# Patient Record
Sex: Male | Born: 1985 | Race: White | Hispanic: No | Smoking: Never smoker
Health system: Southern US, Community
[De-identification: ages and names within clinical notes are randomized; demographics above are authoritative.]

## PROBLEM LIST (undated history)

## (undated) DIAGNOSIS — F32A Depression, unspecified: Secondary | ICD-10-CM

## (undated) DIAGNOSIS — K921 Melena: Secondary | ICD-10-CM

## (undated) DIAGNOSIS — G43909 Migraine, unspecified, not intractable, without status migrainosus: Secondary | ICD-10-CM

## (undated) HISTORY — DX: Depression, unspecified: F32.A

## (undated) HISTORY — DX: Migraine, unspecified, not intractable, without status migrainosus: G43.909

## (undated) HISTORY — DX: Melena: K92.1

---

## 2021-08-22 ENCOUNTER — Encounter: Payer: Self-pay | Admitting: Emergency Medicine

## 2021-08-22 ENCOUNTER — Ambulatory Visit: Payer: BC Managed Care – PPO | Admitting: Emergency Medicine

## 2021-08-22 ENCOUNTER — Other Ambulatory Visit: Payer: Self-pay | Admitting: Emergency Medicine

## 2021-08-22 VITALS — BP 120/86 | HR 84 | Temp 98.2°F | Ht 72.0 in | Wt 313.0 lb

## 2021-08-22 DIAGNOSIS — M545 Low back pain, unspecified: Secondary | ICD-10-CM | POA: Diagnosis not present

## 2021-08-22 DIAGNOSIS — K529 Noninfective gastroenteritis and colitis, unspecified: Secondary | ICD-10-CM | POA: Diagnosis not present

## 2021-08-22 DIAGNOSIS — Z7689 Persons encountering health services in other specified circumstances: Secondary | ICD-10-CM

## 2021-08-22 DIAGNOSIS — Z23 Encounter for immunization: Secondary | ICD-10-CM

## 2021-08-22 DIAGNOSIS — R109 Unspecified abdominal pain: Secondary | ICD-10-CM | POA: Diagnosis not present

## 2021-08-22 DIAGNOSIS — F32A Depression, unspecified: Secondary | ICD-10-CM

## 2021-08-22 DIAGNOSIS — G8929 Other chronic pain: Secondary | ICD-10-CM

## 2021-08-22 DIAGNOSIS — E559 Vitamin D deficiency, unspecified: Secondary | ICD-10-CM

## 2021-08-22 LAB — CBC WITH DIFFERENTIAL/PLATELET
Basophils Absolute: 0 10*3/uL (ref 0.0–0.1)
Basophils Relative: 0.7 % (ref 0.0–3.0)
Eosinophils Absolute: 0.2 10*3/uL (ref 0.0–0.7)
Eosinophils Relative: 2.8 % (ref 0.0–5.0)
HCT: 45.4 % (ref 39.0–52.0)
Hemoglobin: 15.7 g/dL (ref 13.0–17.0)
Lymphocytes Relative: 30.8 % (ref 12.0–46.0)
Lymphs Abs: 1.7 10*3/uL (ref 0.7–4.0)
MCHC: 34.5 g/dL (ref 30.0–36.0)
MCV: 89.6 fl (ref 78.0–100.0)
Monocytes Absolute: 0.5 10*3/uL (ref 0.1–1.0)
Monocytes Relative: 9.5 % (ref 3.0–12.0)
Neutro Abs: 3.1 10*3/uL (ref 1.4–7.7)
Neutrophils Relative %: 56.2 % (ref 43.0–77.0)
Platelets: 159 10*3/uL (ref 150.0–400.0)
RBC: 5.06 Mil/uL (ref 4.22–5.81)
RDW: 13.8 % (ref 11.5–15.5)
WBC: 5.5 10*3/uL (ref 4.0–10.5)

## 2021-08-22 LAB — LIPID PANEL
Cholesterol: 183 mg/dL (ref 0–200)
HDL: 46.2 mg/dL (ref >39.00)
LDL Cholesterol: 102 mg/dL — ABNORMAL HIGH (ref 0–99)
NonHDL: 136.43
Total CHOL/HDL Ratio: 4
Triglycerides: 173 mg/dL — ABNORMAL HIGH (ref 0.0–149.0)
VLDL: 34.6 mg/dL (ref 0.0–40.0)

## 2021-08-22 LAB — COMPREHENSIVE METABOLIC PANEL
ALT: 25 U/L (ref 0–53)
AST: 16 U/L (ref 0–37)
Albumin: 4.8 g/dL (ref 3.5–5.2)
Alkaline Phosphatase: 58 U/L (ref 39–117)
BUN: 17 mg/dL (ref 6–23)
CO2: 25 mEq/L (ref 19–32)
Calcium: 9.5 mg/dL (ref 8.4–10.5)
Chloride: 106 mEq/L (ref 96–112)
Creatinine, Ser: 0.94 mg/dL (ref 0.40–1.50)
GFR: 103.26 mL/min (ref >60.00)
Glucose, Bld: 99 mg/dL (ref 70–99)
Potassium: 4 mEq/L (ref 3.5–5.1)
Sodium: 140 mEq/L (ref 135–145)
Total Bilirubin: 0.4 mg/dL (ref 0.2–1.2)
Total Protein: 7.4 g/dL (ref 6.0–8.3)

## 2021-08-22 LAB — HEMOGLOBIN A1C: Hgb A1c MFr Bld: 5.2 % (ref 4.6–6.5)

## 2021-08-22 LAB — VITAMIN D 25 HYDROXY (VIT D DEFICIENCY, FRACTURES): VITD: 13.27 ng/mL — ABNORMAL LOW (ref 30.00–100.00)

## 2021-08-22 LAB — TSH: TSH: 3.53 u[IU]/mL (ref 0.35–5.50)

## 2021-08-22 LAB — VITAMIN B12: Vitamin B-12: 282 pg/mL (ref 211–911)

## 2021-08-22 MED ORDER — VITAMIN D (ERGOCALCIFEROL) 1.25 MG (50000 UNIT) PO CAPS: 50000.0000 [IU] | ORAL_CAPSULE | ORAL | 1 refills | Status: DC

## 2021-08-22 NOTE — Progress Notes (Signed)
Danny Grant ?36 y.o. ? ? ?Chief Complaint  ?Patient presents with  ? New Patient (Initial Visit)  ? issues with depresstion   ? Back Pain  ? digestive issues   ? ? ?HISTORY OF PRESENT ILLNESS: ?This is a 36 y.o. male first visit to this office, here to establish care with me.  Here with his wife. ?Has the following concerns: ?1.  Chronic "stomach issues".  Mostly diffuse abdominal cramps and diarrhea worsened by eating. ?Started since he was a teenager.  Occasional blood in the stools. ?Needs GI referral for evaluation ?2.  Chronic low back pain since age of 37.  Denies any injuries.  Needs orthopedic evaluation ?3.  Chronic depression since a kid.  Needs referral to behavioral health.  On no medications at present time. ?No other complaints or medical concerns today. ? ?HPI ? ? ?Prior to Admission medications   ?Not on File  ? ? ?No Known Allergies ? ?There are no problems to display for this patient. ? ? ?History reviewed. No pertinent past medical history. ? ?History reviewed. No pertinent surgical history. ? ?Social History  ? ?Socioeconomic History  ? Marital status: Single  ?  Spouse name: Not on file  ? Number of children: Not on file  ? Years of education: Not on file  ? Highest education level: Not on file  ?Occupational History  ? Not on file  ?Tobacco Use  ? Smoking status: Not on file  ? Smokeless tobacco: Not on file  ?Substance and Sexual Activity  ? Alcohol use: Not on file  ? Drug use: Not on file  ? Sexual activity: Not on file  ?Other Topics Concern  ? Not on file  ?Social History Narrative  ? Not on file  ? ?Social Determinants of Health  ? ?Financial Resource Strain: Not on file  ?Food Insecurity: Not on file  ?Transportation Needs: Not on file  ?Physical Activity: Not on file  ?Stress: Not on file  ?Social Connections: Not on file  ?Intimate Partner Violence: Not on file  ? ? ?History reviewed. No pertinent family history. ? ? ?Review of Systems  ?Constitutional: Negative.  Negative for chills  and fever.  ?HENT: Negative.  Negative for congestion and sore throat.   ?Respiratory: Negative.  Negative for cough and shortness of breath.   ?Cardiovascular: Negative.  Negative for chest pain and palpitations.  ?Gastrointestinal:  Positive for abdominal pain, blood in stool and diarrhea.  ?Genitourinary: Negative.   ?Musculoskeletal:  Positive for back pain.  ?Skin: Negative.  Negative for rash.  ?Neurological:  Negative for dizziness and headaches.  ?Psychiatric/Behavioral:  Positive for depression. Negative for suicidal ideas.   ?All other systems reviewed and are negative. ? ?Today's Vitals  ? 08/22/21 1422  ?BP: 120/86  ?Pulse: 84  ?Temp: 98.2 ?F (36.8 ?C)  ?TempSrc: Oral  ?SpO2: 97%  ?Weight: (!) 313 lb (142 kg)  ?Height: 6' (1.829 m)  ? ?Body mass index is 42.45 kg/m?. ? ?Physical Exam ?Vitals reviewed.  ?Constitutional:   ?   Appearance: Normal appearance.  ?HENT:  ?   Head: Normocephalic.  ?   Right Ear: Tympanic membrane, ear canal and external ear normal.  ?   Left Ear: Tympanic membrane, ear canal and external ear normal.  ?Eyes:  ?   Extraocular Movements: Extraocular movements intact.  ?   Conjunctiva/sclera: Conjunctivae normal.  ?   Pupils: Pupils are equal, round, and reactive to light.  ?Cardiovascular:  ?   Rate and  Rhythm: Normal rate and regular rhythm.  ?   Pulses: Normal pulses.  ?   Heart sounds: Normal heart sounds.  ?Pulmonary:  ?   Effort: Pulmonary effort is normal.  ?   Breath sounds: Normal breath sounds.  ?Abdominal:  ?   General: There is no distension.  ?   Palpations: Abdomen is soft.  ?   Tenderness: There is no abdominal tenderness.  ?Musculoskeletal:     ?   General: Normal range of motion.  ?   Cervical back: No tenderness.  ?Lymphadenopathy:  ?   Cervical: No cervical adenopathy.  ?Skin: ?   General: Skin is warm and dry.  ?   Capillary Refill: Capillary refill takes less than 2 seconds.  ?Neurological:  ?   General: No focal deficit present.  ?   Mental Status: He is alert  and oriented to person, place, and time.  ?Psychiatric:     ?   Mood and Affect: Mood normal.     ?   Behavior: Behavior normal.  ? ? ? ?ASSESSMENT & PLAN: ?A total of 60 minutes was spent with the patient and counseling/coordination of care regarding preparing for this visit, review of available medical records, review of chronic medical problems and their management, comprehensive history and physical examination, need for several referrals including GI and psychiatry, education on nutrition, prognosis, documentation and need for follow-up. ? ?Problem List Items Addressed This Visit   ? ?  ? Digestive  ? Chronic diarrhea  ?  Occasionally with blood.  Inflammatory bowel disease needs to be ruled out.  Needs referral to GI for colonoscopy. ?Differential diagnosis discussed with patient including possibility of gluten sensitivity. ? ?  ?  ? Relevant Orders  ? Ambulatory referral to Gastroenterology  ? Tissue transglutaminase, IgA  ? TSH  ? Hemoglobin A1c  ? Lipid panel  ? Comprehensive metabolic panel  ? CBC with Differential/Platelet  ? Vitamin B12  ? VITAMIN D 25 Hydroxy (Vit-D Deficiency, Fractures)  ?  ? Other  ? Chronic depression  ?  For at least 15 years.  Not on medication at present time. ?Needs psychiatry evaluation.  Counseling given. ? ?  ?  ? Relevant Orders  ? Ambulatory referral to Psychiatry  ? Chronic bilateral low back pain without sciatica  ?  For at least 10 years.  No significant injuries in the past. ?Needs evaluation by sports medicine doctor. ? ?  ?  ? Relevant Orders  ? Ambulatory referral to Sports Medicine  ? Chronic abdominal pain - Primary  ?  Continues and affecting quality of life.  Diet and nutrition discussed. ?Differential diagnosis discussed.  Needs referral to gastrointestinal doctor and colonoscopy. ? ?  ?  ? Relevant Orders  ? Ambulatory referral to Gastroenterology  ? Tissue transglutaminase, IgA  ? TSH  ? Hemoglobin A1c  ? Lipid panel  ? Comprehensive metabolic panel  ? CBC  with Differential/Platelet  ? Vitamin B12  ? VITAMIN D 25 Hydroxy (Vit-D Deficiency, Fractures)  ? Morbid obesity ([place])  ?  Diet and nutrition extensively discussed.  Advised to decrease amount of daily carbohydrate intake and watch caloric intake. ? ?  ?  ? ?Other Visit Diagnoses   ? ? Need for vaccination      ? Relevant Orders  ? Tdap vaccine greater than or equal to 7yo IM (Completed)  ? Encounter to establish care      ? ?  ? ?Patient Instructions  ?Health Maintenance,  Male ?Adopting a healthy lifestyle and getting preventive care are important in promoting health and wellness. Ask your health care provider about: ?The right schedule for you to have regular tests and exams. ?Things you can do on your own to prevent diseases and keep yourself healthy. ?What should I know about diet, weight, and exercise? ?Eat a healthy diet ? ?Eat a diet that includes plenty of vegetables, fruits, low-fat dairy products, and lean protein. ?Do not eat a lot of foods that are high in solid fats, added sugars, or sodium. ?Maintain a healthy weight ?Body mass index (BMI) is a measurement that can be used to identify possible weight problems. It estimates body fat based on height and weight. Your health care provider can help determine your BMI and help you achieve or maintain a healthy weight. ?Get regular exercise ?Get regular exercise. This is one of the most important things you can do for your health. Most adults should: ?Exercise for at least 150 minutes each week. The exercise should increase your heart rate and make you sweat (moderate-intensity exercise). ?Do strengthening exercises at least twice a week. This is in addition to the moderate-intensity exercise. ?Spend less time sitting. Even light physical activity can be beneficial. ?Watch cholesterol and blood lipids ?Have your blood tested for lipids and cholesterol at 38 years of age, then have this test every 5 years. ?You may need to have your cholesterol levels checked  more often if: ?Your lipid or cholesterol levels are high. ?You are older than 38 years of age. ?You are at high risk for heart disease. ?What should I know about cancer screening? ?Many types of cancer

## 2021-08-22 NOTE — Patient Instructions (Signed)
Health Maintenance, Male Adopting a healthy lifestyle and getting preventive care are important in promoting health and wellness. Ask your health care provider about: The right schedule for you to have regular tests and exams. Things you can do on your own to prevent diseases and keep yourself healthy. What should I know about diet, weight, and exercise? Eat a healthy diet  Eat a diet that includes plenty of vegetables, fruits, low-fat dairy products, and lean protein. Do not eat a lot of foods that are high in solid fats, added sugars, or sodium. Maintain a healthy weight Body mass index (BMI) is a measurement that can be used to identify possible weight problems. It estimates body fat based on height and weight. Your health care provider can help determine your BMI and help you achieve or maintain a healthy weight. Get regular exercise Get regular exercise. This is one of the most important things you can do for your health. Most adults should: Exercise for at least 150 minutes each week. The exercise should increase your heart rate and make you sweat (moderate-intensity exercise). Do strengthening exercises at least twice a week. This is in addition to the moderate-intensity exercise. Spend less time sitting. Even light physical activity can be beneficial. Watch cholesterol and blood lipids Have your blood tested for lipids and cholesterol at 38 years of age, then have this test every 5 years. You may need to have your cholesterol levels checked more often if: Your lipid or cholesterol levels are high. You are older than 38 years of age. You are at high risk for heart disease. What should I know about cancer screening? Many types of cancers can be detected early and may often be prevented. Depending on your health history and family history, you may need to have cancer screening at various ages. This may include screening for: Colorectal cancer. Prostate cancer. Skin cancer. Lung  cancer. What should I know about heart disease, diabetes, and high blood pressure? Blood pressure and heart disease High blood pressure causes heart disease and increases the risk of stroke. This is more likely to develop in people who have high blood pressure readings or are overweight. Talk with your health care provider about your target blood pressure readings. Have your blood pressure checked: Every 3-5 years if you are 18-39 years of age. Every year if you are 40 years old or older. If you are between the ages of 65 and 75 and are a current or former smoker, ask your health care provider if you should have a one-time screening for abdominal aortic aneurysm (AAA). Diabetes Have regular diabetes screenings. This checks your fasting blood sugar level. Have the screening done: Once every three years after age 45 if you are at a normal weight and have a low risk for diabetes. More often and at a younger age if you are overweight or have a high risk for diabetes. What should I know about preventing infection? Hepatitis B If you have a higher risk for hepatitis B, you should be screened for this virus. Talk with your health care provider to find out if you are at risk for hepatitis B infection. Hepatitis C Blood testing is recommended for: Everyone born from 1945 through 1965. Anyone with known risk factors for hepatitis C. Sexually transmitted infections (STIs) You should be screened each year for STIs, including gonorrhea and chlamydia, if: You are sexually active and are younger than 38 years of age. You are older than 38 years of age and your   health care provider tells you that you are at risk for this type of infection. Your sexual activity has changed since you were last screened, and you are at increased risk for chlamydia or gonorrhea. Ask your health care provider if you are at risk. Ask your health care provider about whether you are at high risk for HIV. Your health care provider  may recommend a prescription medicine to help prevent HIV infection. If you choose to take medicine to prevent HIV, you should first get tested for HIV. You should then be tested every 3 months for as long as you are taking the medicine. Follow these instructions at home: Alcohol use Do not drink alcohol if your health care provider tells you not to drink. If you drink alcohol: Limit how much you have to 0-2 drinks a day. Know how much alcohol is in your drink. In the [place] one drink equals one 12 oz bottle of beer (355 mL), one 5 oz glass of wine (148 mL), or one 1 oz glass of hard liquor (44 mL). Lifestyle Do not use any products that contain nicotine or tobacco. These products include cigarettes, chewing tobacco, and vaping devices, such as e-cigarettes. If you need help quitting, ask your health care provider. Do not use street drugs. Do not share needles. Ask your health care provider for help if you need support or information about quitting drugs. General instructions Schedule regular health, dental, and eye exams. Stay current with your vaccines. Tell your health care provider if: You often feel depressed. You have ever been abused or do not feel safe at home. Summary Adopting a healthy lifestyle and getting preventive care are important in promoting health and wellness. Follow your health care provider's instructions about healthy diet, exercising, and getting tested or screened for diseases. Follow your health care provider's instructions on monitoring your cholesterol and blood pressure. This information is not intended to replace advice given to you by your health care provider. Make sure you discuss any questions you have with your health care provider. Document Revised: 08/28/2020 Document Reviewed: 08/28/2020 Elsevier Patient Education  2023 [employer] Inc[employer]  

## 2021-08-22 NOTE — Assessment & Plan Note (Signed)
For at least 15 years.  Not on medication at present time. ?Needs psychiatry evaluation.  Counseling given. ?

## 2021-08-22 NOTE — Assessment & Plan Note (Signed)
Continues and affecting quality of life.  Diet and nutrition discussed. ?Differential diagnosis discussed.  Needs referral to gastrointestinal doctor and colonoscopy. ?

## 2021-08-22 NOTE — Assessment & Plan Note (Signed)
Diet and nutrition extensively discussed.  Advised to decrease amount of daily carbohydrate intake and watch caloric intake. ?

## 2021-08-22 NOTE — Assessment & Plan Note (Signed)
Occasionally with blood.  Inflammatory bowel disease needs to be ruled out.  Needs referral to GI for colonoscopy. ?Differential diagnosis discussed with patient including possibility of gluten sensitivity. ?

## 2021-08-22 NOTE — Assessment & Plan Note (Signed)
For at least 10 years.  No significant injuries in the past. ?Needs evaluation by sports medicine doctor. ?

## 2021-08-27 NOTE — Progress Notes (Signed)
? ?   [name] D.Judd Gaudier ?[facility] ?[address] [place] [address] ?Phone: [phone] ?  ?Assessment and Plan:   ?  ?1. Chronic bilateral low back pain without sciatica ?-Chronic with exacerbation, initial sports medicine visit ?- Unclear etiology of chronic low back pain with multiple likely contributing factors including patient's baseline physical activity, lifting requirements at work, BMI.  No red flag symptoms on physical exam ?- Start meloxicam 15 mg daily x2 weeks.  If still having pain after 2 weeks, complete 3rd-week of meloxicam. May use remaining meloxicam as needed once daily for pain control.  Do not to use additional NSAIDs while taking meloxicam.  May use Tylenol [phone] mg 2 to 3 times a day for breakthrough pain. ?- Start home exercises focusing on low back, posterior chain, core to help offload low back ?- X-ray obtained in clinic.  My interpretation: No acute fracture or vertebral collapse.  Mild retrolisthesis L5 on S1 without definitive pars fracture seen ?- DG Lumbar Spine 2-3 Views; Future  ?  ?Pertinent previous records reviewed include PCP note 08/22/2021 ?  ?Follow Up: 3 weeks for reevaluation.  Could consider adding OMT versus PT.  Would order lumbar spine MRI if no improvement with 6 weeks of conservative therapy ?  ?Subjective:   ?I, Jerene Canny, am serving as a Neurosurgeon for Doctor [employer] ? ?Chief Complaint: back pain  ? ?HPI:  ?08/28/2021 ?Patient is a 36 year old male complaining of back pain. Patient states that this has been going on for a long time since he was 15 threw it out when he was 23 and that lasted 9 months that pain "went away" and now when he does to much and stops and rest he is in pain he will get electric shocks that shoot down his legs his mother has the similar problem , does get radiating pain down his leg to his calf , pain is locate right in the center of his low back , has been taking and tylenol but only helps a  little with the constant pains , not the electrical shocks  ? ?Relevant Historical Information: Elevated BMI ? ?Additional pertinent review of systems negative. ? ? ?Current Outpatient Medications:  ?  meloxicam (MOBIC) 15 MG tablet, Take 1 tablet (15 mg total) by mouth daily., Disp: 30 tablet, Rfl: 0 ?  Vitamin D, Ergocalciferol, (DRISDOL) 1.25 MG (50000 UNIT) CAPS capsule, Take 1 capsule (50,000 Units total) by mouth every 7 (seven) days., Disp: 7 capsule, Rfl: 1  ? ?Objective:   ?  ?Vitals:  ? 08/28/21 1457  ?BP: 134/80  ?Pulse: 77  ?SpO2: 99%  ?Weight: (!) 309 lb (140.2 kg)  ?Height: 6' (1.829 m)  ?  ?  ?Body mass index is 41.91 kg/m?.  ?  ?Physical Exam:   ? ?Gen: Appears well, nad, nontoxic and pleasant ?Psych: Alert and oriented, appropriate mood and affect ?Neuro: sensation intact, strength is 5/5 in upper and lower extremities, muscle tone wnl ?Skin: no susupicious lesions or rashes ? ?Back - Normal skin, Spine with normal alignment and no deformity.   ?No tenderness to vertebral process palpation.   ?Paraspinous muscles are not tender and without spasm ?Straight leg raise negative ?Pain with lumbar extension ? ? ?Electronically signed by:  ?[name] D.Judd Gaudier ?[facility] ?3:27 PM 08/28/21 ?

## 2021-08-28 ENCOUNTER — Ambulatory Visit (INDEPENDENT_AMBULATORY_CARE_PROVIDER_SITE_OTHER): Payer: BC Managed Care – PPO

## 2021-08-28 ENCOUNTER — Ambulatory Visit (INDEPENDENT_AMBULATORY_CARE_PROVIDER_SITE_OTHER): Payer: BC Managed Care – PPO | Admitting: Sports Medicine

## 2021-08-28 VITALS — BP 134/80 | HR 77 | Ht 72.0 in | Wt 309.0 lb

## 2021-08-28 DIAGNOSIS — M545 Low back pain, unspecified: Secondary | ICD-10-CM

## 2021-08-28 DIAGNOSIS — M5137 Other intervertebral disc degeneration, lumbosacral region: Secondary | ICD-10-CM | POA: Diagnosis not present

## 2021-08-28 DIAGNOSIS — G8929 Other chronic pain: Secondary | ICD-10-CM

## 2021-08-28 MED ORDER — MELOXICAM 15 MG PO TABS
15.0000 mg | ORAL_TABLET | Freq: Every day | ORAL | 0 refills | Status: DC
Start: 2021-08-28 — End: 2021-09-07

## 2021-08-28 NOTE — Patient Instructions (Addendum)
Good to see you  ?- Start meloxicam 15 mg daily x2 weeks.  If still having pain after 2 weeks, complete 3rd-week of meloxicam. May use remaining meloxicam as needed once daily for pain control.  Do not to use additional NSAIDs while taking meloxicam.  May use Tylenol [phone] mg 2 to 3 times a day for breakthrough pain. ?Core and posterior chain HEP ?3 week follow up  ?

## 2021-08-29 LAB — TISSUE TRANSGLUTAMINASE, IGA: (tTG) Ab, IgA: <1 U/mL

## 2021-09-06 ENCOUNTER — Encounter: Payer: Self-pay | Admitting: Emergency Medicine

## 2021-09-06 ENCOUNTER — Encounter: Payer: Self-pay | Admitting: Sports Medicine

## 2021-09-07 ENCOUNTER — Other Ambulatory Visit: Payer: Self-pay | Admitting: Sports Medicine

## 2021-09-07 MED ORDER — MELOXICAM 15 MG PO TABS: 15.0000 mg | ORAL_TABLET | Freq: Every day | ORAL | 0 refills | Status: DC

## 2021-09-13 NOTE — Progress Notes (Deleted)
    [name] D[facility] [address] [place] [address] Phone: [phone]   Assessment and Plan:     There are no diagnoses linked to this encounter.  ***   Pertinent previous records reviewed include ***   Follow Up: ***     Subjective:   I, Rachelle Edwards, am serving as a Neurosurgeon for Doctor Richardean Sale   Chief Complaint: back pain    HPI:  08/28/2021 Patient is a 36 year old male complaining of back pain. Patient states that this has been going on for a long time since he was 56 threw it out when he was 23 and that lasted 9 months that pain "went away" and now when he does to much and stops and rest he is in pain he will get electric shocks that shoot down his legs his mother has the similar problem , does get radiating pain down his leg to his calf , pain is locate right in the center of his low back , has been taking and tylenol but only helps a little with the constant pains , not the electrical shocks   09/18/2021 Patient states   Relevant Historical Information: Elevated BMI  Additional pertinent review of systems negative.   Current Outpatient Medications:    meloxicam (MOBIC) 15 MG tablet, Take 1 tablet (15 mg total) by mouth daily., Disp: 30 tablet, Rfl: 0   Vitamin D, Ergocalciferol, (DRISDOL) 1.25 MG (50000 UNIT) CAPS capsule, Take 1 capsule (50,000 Units total) by mouth every 7 (seven) days., Disp: 7 capsule, Rfl: 1   Objective:     There were no vitals filed for this visit.    There is no height or weight on file to calculate BMI.    Physical Exam:    ***   Electronically signed by:  [name] [facility] 7:14 AM 09/13/21

## 2021-09-13 NOTE — Progress Notes (Deleted)
    [name] [address] [place] [address] Phone: [phone]   Assessment and Plan:     There are no diagnoses linked to this encounter.  ***   Pertinent previous records reviewed include ***   Follow Up: ***     Subjective:   I, Danny Grant, am serving as a Neurosurgeon for Doctor Richardean Sale  Chief Complaint: neck pain   HPI:   09/18/2021 Patient is a 36 year old male complaining of neck pain . Patient states   Relevant Historical Information: ***  Additional pertinent review of systems negative.   Current Outpatient Medications:    meloxicam (MOBIC) 15 MG tablet, Take 1 tablet (15 mg total) by mouth daily., Disp: 30 tablet, Rfl: 0   Vitamin D, Ergocalciferol, (DRISDOL) 1.25 MG (50000 UNIT) CAPS capsule, Take 1 capsule (50,000 Units total) by mouth every 7 (seven) days., Disp: 7 capsule, Rfl: 1   Objective:     There were no vitals filed for this visit.    There is no height or weight on file to calculate BMI.    Physical Exam:    ***   Electronically signed by:  [name] [facility] 7:13 AM 09/13/21

## 2021-09-14 ENCOUNTER — Ambulatory Visit: Payer: BC Managed Care – PPO | Admitting: Sports Medicine

## 2021-09-18 ENCOUNTER — Ambulatory Visit: Payer: BC Managed Care – PPO | Admitting: Sports Medicine

## 2021-09-19 NOTE — Progress Notes (Unsigned)
    [name] D[facility] [address] [place] [address] Phone: [phone]   Assessment and Plan:     There are no diagnoses linked to this encounter.  ***   Pertinent previous records reviewed include ***   Follow Up: ***     Subjective:   I, Layali Freund, am serving as a Neurosurgeon for Doctor Richardean Sale   Chief Complaint: back pain    HPI:  08/28/2021 Patient is a 36 year old male complaining of back pain. Patient states that this has been going on for a long time since he was 80 threw it out when he was 23 and that lasted 9 months that pain "went away" and now when he does to much and stops and rest he is in pain he will get electric shocks that shoot down his legs his mother has the similar problem , does get radiating pain down his leg to his calf , pain is locate right in the center of his low back , has been taking and tylenol but only helps a little with the constant pains , not the electrical shocks   09/20/2021 Patient states   Relevant Historical Information: Elevated BMI  Additional pertinent review of systems negative.   Current Outpatient Medications:    meloxicam (MOBIC) 15 MG tablet, Take 1 tablet (15 mg total) by mouth daily., Disp: 30 tablet, Rfl: 0   Vitamin D, Ergocalciferol, (DRISDOL) 1.25 MG (50000 UNIT) CAPS capsule, Take 1 capsule (50,000 Units total) by mouth every 7 (seven) days., Disp: 7 capsule, Rfl: 1   Objective:     There were no vitals filed for this visit.    There is no height or weight on file to calculate BMI.    Physical Exam:    ***   Electronically signed by:  [name] [facility] 11:42 AM 09/19/21

## 2021-09-20 ENCOUNTER — Ambulatory Visit (INDEPENDENT_AMBULATORY_CARE_PROVIDER_SITE_OTHER): Payer: BC Managed Care – PPO | Admitting: Sports Medicine

## 2021-09-20 VITALS — BP 118/80 | HR 89 | Ht 72.0 in | Wt 311.0 lb

## 2021-09-20 DIAGNOSIS — G8929 Other chronic pain: Secondary | ICD-10-CM | POA: Diagnosis not present

## 2021-09-20 DIAGNOSIS — M545 Low back pain, unspecified: Secondary | ICD-10-CM | POA: Diagnosis not present

## 2021-09-20 MED ORDER — MELOXICAM 15 MG PO TABS: 15.0000 mg | ORAL_TABLET | Freq: Every day | ORAL | 0 refills | Status: DC

## 2021-09-20 NOTE — Patient Instructions (Addendum)
Good to see you  Continue HEP Refill meloxicam can use daily as needed  As needed follow up

## 2021-10-15 ENCOUNTER — Encounter: Payer: Self-pay | Admitting: Emergency Medicine

## 2021-10-18 ENCOUNTER — Encounter: Payer: Self-pay | Admitting: Emergency Medicine

## 2021-10-19 ENCOUNTER — Other Ambulatory Visit: Payer: Self-pay | Admitting: Sports Medicine

## 2021-10-22 ENCOUNTER — Other Ambulatory Visit: Payer: Self-pay | Admitting: Emergency Medicine

## 2021-10-22 NOTE — Telephone Encounter (Signed)
Referral placed to medical weight management clinic.  Thanks.

## 2021-11-09 ENCOUNTER — Encounter: Payer: Self-pay | Admitting: Emergency Medicine

## 2021-11-20 ENCOUNTER — Other Ambulatory Visit: Payer: Self-pay | Admitting: Sports Medicine

## 2021-11-26 ENCOUNTER — Ambulatory Visit (HOSPITAL_BASED_OUTPATIENT_CLINIC_OR_DEPARTMENT_OTHER): Payer: BC Managed Care – PPO | Admitting: Psychiatry

## 2021-11-26 ENCOUNTER — Encounter (HOSPITAL_COMMUNITY): Payer: Self-pay | Admitting: Psychiatry

## 2021-11-26 VITALS — Wt 294.0 lb

## 2021-11-26 DIAGNOSIS — F4010 Social phobia, unspecified: Secondary | ICD-10-CM

## 2021-11-26 DIAGNOSIS — F32A Depression, unspecified: Secondary | ICD-10-CM | POA: Diagnosis not present

## 2021-11-26 MED ORDER — SERTRALINE HCL 50 MG PO TABS
50.0000 mg | ORAL_TABLET | Freq: Every day | ORAL | 1 refills | Status: DC
Start: 2021-11-26 — End: 2022-02-20

## 2021-11-26 NOTE — Progress Notes (Signed)
[facility] Initial Assessment Note  Patient Location:Truck Provider Location:Home Office   I connected with Danny Grant by video and verified that I am talking with correct person using two identifiers.   I discussed the limitations, risks, security and privacy concerns of performing an evaluation and management service virtually and the availability of in person appointments. I also discussed with the patient that there may be a patient responsible charge related to this service. The patient expressed understanding and agreed to proceed.  [name] [id number] 36 y.o.  11/26/2021 1:14 PM  Chief Complaint:  I want to try something to help anxiety.   History of Present Illness:  Danny Grant is a 8 year old Caucasian, employed lives with his girlfriend self-referred for seeking treatment for his anxiety and chronic depression.  Patient told he is struggling with chronic anxiety and depression most of his life.  Now he wants to get treatment because he has a better job, insurance and like to improve his life in relationship with his girlfriend.  He reported severe anxiety and nervousness around people.  He does not want to go anywhere and stays home.  He is only purpose to go out when he go to job.  His job requires traveling.  He admitted does not like meeting strangers and new people.  He avoids going to public places because he gets very sweaty hands, trembling, nervousness and sometimes he arguments with his girlfriend.  He does not like going to shopping or any sports events.  He feels very Danny Grant, not comfortable and severe anxiety around people.  He gets very anxious when he was told to meet people.  For the same reason he has not seen his girlfriend's family and that causes some strain in the relationship.  He recalled there are incidents when he see his neighbor sitting outside on the porch he does not like to drop the trash because he has to go outside.  He prefer to stay home  to watch movies and he used to enjoy video games and reading but has not done lately.  He admitted it is affecting his relationship and he feels he need to address the symptoms.  He has a steady relationship with his girlfriend for 20 years.  His 65 year old younger sister lives with them who also have a lot of anxiety.  He admitted sometime chronic depression with feeling of hopelessness and worthlessness and occasionally passive and fleeting suicidal thoughts but never had any plan or any intent.  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  He has done the same job in [place] but he reported that job was very miserable and he did not like.  Patient moved to [place] from [place] 2 years ago.  Patient reported his parents are deceased at their early age.  His father died 4 years ago due to cancer and he took care of his father and his later life.  Patient denies any hallucination, paranoia, violence, mania, nightmares or flashback.  He has never seen psychiatrist and never taken any psychotropic medication.  He recalled at age 40 saw a therapist briefly for anxiety and depression.  He does not like talking to therapy and prefer if he can take medicine to help his symptoms.  He admitted weight gain excessively in recent years but now trying to lose weight and had lost 20 pounds in past few months.  Denies headaches, seizures, legal issues,  head injury.  He denies any illegal substance use.  He drinks alcohol on and off.  He denies any binge, intoxication or withdrawals.  He denies any OCD symptoms, PTSD, mania, psychosis.  He sleeps fair.  He reported he has chronic insomnia as far as he remember.  Though he did not specify his anxiety stressors but admitted sometime he worries about his finances, traveling.  He reported there is an upcoming [place] cruise coming up and is very anxious because he has to fly.  For  the same reason his sister decided to drop that trip.  Patient told he does not want to cancel because it would hurt his girlfriend's feeling.  He is open to try a medication to help his symptoms.  Past Psychiatric History: Chronic history of depression, anxiety.  Never given any medication and never seen by psychiatrist.  No history of suicidal attempt, psychosis, mania, inpatient treatment.  Family History  Problem Relation Age of Onset   Intellectual disability Mother    Early death Mother    Depression Mother    Cancer Mother    Early death Father    Depression Father    Cancer Father    Intellectual disability Father    Depression Sister    Intellectual disability Sister    Early death Maternal Grandmother    Cancer Maternal Grandfather    Hyperlipidemia Paternal Grandmother    Heart disease Paternal Grandmother    Early death Paternal Grandmother       Past Medical History:  Diagnosis Date   Blood in stool    Depression    Migraines      Traumatic Head Injury: Denies head injury.  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   Psychosocial History; Patient born and raised in [place] near [place] area.  His parents are deceased.  He has limited social network.  He is in a steady relationship with his girlfriend for 20 years.  His 58 year old younger sister lives with him.  Legal History; Denies legal issues.   History Of Abuse; Denies any history of abuse.  Substance Abuse History; Denies any history of illegal substance use.  Neurologic: Headache: No Seizure: No Paresthesias: No   Outpatient Encounter Medications as of 11/26/2021  Medication Sig   meloxicam (MOBIC) 15 MG tablet Take 1 tablet (15 mg total) by mouth daily.   Vitamin D, Ergocalciferol, (DRISDOL) 1.25 MG (50000 UNIT) CAPS capsule Take 1 capsule (50,000 Units total) by mouth every 7 (seven) days.   No facility-administered encounter medications on file as of 11/26/2021.    No  results found for this or any previous visit (from the past 2160 hour(s)).    Constitutional:  There were no vitals taken for this visit.   Musculoskeletal: Strength & Muscle Tone: within normal limits Gait & Station: normal Patient leans: N/A  Psychiatric Specialty Exam: Physical Exam  ROS  There were no vitals taken for this visit.There is no height or weight on file to calculate BMI.  General Appearance: Casual  Eye Contact:  Fair  Speech:  Normal Rate  Volume:  Normal  Mood:  Anxious and Dysphoric  Affect:  Constricted  Thought Process:  Goal Directed  Orientation:  Full (Time, Place, and Person)  Thought Content:  Rumination  Suicidal Thoughts:  No  Homicidal Thoughts:  No  Memory:  Immediate;   Good Recent;   Good Remote;   Good  Judgement:  Fair  Insight:  Fair  Psychomotor Activity:  Decreased  Concentration:  Concentration: Fair and Attention Span: Fair  Recall:  Good  Fund of Knowledge:  Good  Language:  Good  Akathisia:  No  Handed:  Right  AIMS (if indicated):     Assets:  Communication Skills Housing Social Support Talents/Skills Transportation  ADL's:  Intact  Cognition:  WNL  Sleep:   chronic insomnia     Assessment/Plan:  Zylen is a 36 year old Caucasian, employed man with chronic history of depression, social anxiety disorder self-referred for seeking treatment.  He has never seen psychiatrist and never taken the medication in the past.  He is not interested in therapy.  We talk about multiple options and choices to start medication.  We agreed to try low-dose Zoloft starting 25 mg for 1 week and then 50 mg daily to help the symptoms.  We talk about SSRIs side effects, benefits in detail.  Patient agreed with the plan.  I recommend to call us back if is any question or any concern.  I provided nurse contact information if he has any question about medication side effects refills.  Discussed safety concerns and anytime having active suicidal  thoughts or homicidal thought that he need to call 911 and the local emergency room.  Follow-up in 3 to 4 weeks.  Patient is not interested in therapy.  Cleotis Nipper, MD 11/26/2021    Follow Up Instructions: I discussed the assessment and treatment plan with the patient. The patient was provided an opportunity to ask questions and all were answered. The patient agreed with the plan and demonstrated an understanding of the instructions.   The patient was advised to call back or seek an in-person evaluation if the symptoms worsen or if the condition fails to improve as anticipated.   Collaboration of Care: Primary Care Provider AEB notes are available in epic to review.   Patient/Guardian was advised Release of Information must be obtained prior to any record release in order to collaborate their care with an outside provider. Patient/Guardian was advised if they have not already done so to contact the registration department to sign all necessary forms in order for [place] to release information regarding their care.    Consent: Patient/Guardian gives verbal consent for treatment and assignment of benefits for services provided during this visit. Patient/Guardian expressed understanding and agreed to proceed.     I provided 56 minutes of non-face-to-face time during this encounter.

## 2021-12-21 ENCOUNTER — Telehealth (HOSPITAL_COMMUNITY): Payer: BC Managed Care – PPO | Admitting: Psychiatry

## 2021-12-21 ENCOUNTER — Encounter (HOSPITAL_COMMUNITY): Payer: Self-pay | Admitting: Psychiatry

## 2021-12-21 VITALS — Wt 295.0 lb

## 2021-12-21 DIAGNOSIS — F4010 Social phobia, unspecified: Secondary | ICD-10-CM | POA: Diagnosis not present

## 2021-12-21 DIAGNOSIS — F32A Depression, unspecified: Secondary | ICD-10-CM

## 2021-12-21 MED ORDER — DESVENLAFAXINE SUCCINATE ER 50 MG PO TB24: 50.0000 mg | ORAL_TABLET | Freq: Every day | ORAL | 1 refills | Status: DC

## 2021-12-21 NOTE — Progress Notes (Signed)
Virtual Visit via Video Note  I connected with Danny Grant on 12/21/21 at 11:00 AM EDT by a video enabled telemedicine application and verified that I am speaking with the correct person using two identifiers.  Location: Patient: In Truck Provider: Home Office   I discussed the limitations of evaluation and management by telemedicine and the availability of in person appointments. The patient expressed understanding and agreed to proceed.  History of Present Illness: Patient is evaluated by video session.  He is in his truck.  We started him on Zoloft because he is having a lot of anxiety, nervousness around people.  He gets sweaty and overwhelmed going to public places.  He is very isolated and does not leave the house unless he has to go.  He lives with his girlfriend and younger sister who also has anxiety.  He noticed talking to the people make his hands sweaty, tremble and nervous.  He has upcoming trip to cruise and he has to fly to [place] and he is nervous about it.  Today he mentioned he had a trip in the past overseas to [place].  He was very nervous during flying but want to get there he was able to handle the stress.  He reported Zoloft make him sleepy and drowsy but he did not notice any improvement in his anxiety.  He still reported being isolated, to himself and watch movies or video games.  He had a steady relationship with his girlfriend for 20 years.  He feels sometimes very pressure from his girlfriend to meet people especially his family.  Patient is not interested in therapy at this time.  His job requires traveling and he is pleased because he does not have to meet people as such.  He denies drinking or using any illegal substances.  He denies any panic attack, hallucination, paranoia, suicidal thoughts.  His appetite is okay.  His weight is unchanged from the past.  Past Psychiatric History: Chronic history of depression, anxiety.  No meds in past. No h/o suicidal attempt,  psychosis, mania, inpatient treatment.    Psychiatric Specialty Exam: Physical Exam  Review of Systems  Weight 295 lb (133.8 kg).There is no height or weight on file to calculate BMI.  General Appearance: Casual  Eye Contact:  Fair  Speech:  Normal Rate  Volume:  Decreased  Mood:  Anxious and shy  Affect:  Constricted  Thought Process:  Descriptions of Associations: Intact  Orientation:  Full (Time, Place, and Person)  Thought Content:  Rumination  Suicidal Thoughts:  No  Homicidal Thoughts:  No  Memory:  Immediate;   Good Recent;   Good Remote;   Good  Judgement:  Intact  Insight:  Present  Psychomotor Activity:  Normal  Concentration:  Concentration: Good and Attention Span: Good  Recall:  Good  Fund of Knowledge:  Good  Language:  Good  Akathisia:  No  Handed:  Right  AIMS (if indicated):     Assets:  Communication Skills Desire for Improvement Housing Talents/Skills Transportation  ADL's:  Intact  Cognition:  WNL  Sleep:   still insomnia.      Assessment and Plan: Social anxiety disorder.  Chronic depression.  Discontinue Zoloft as patient has not seen any improvement other than he feels groggy.  We talk about changing the medication and try Pristiq 50 mg.  He will start half tablet in the beginning and then he will take full tablet.  Once again I encourage to consider therapy to help  his coping skills.  Patient agreed to look into with in the future.  I recommended to call us back if he has any question or any concern.  We will follow up in 6 weeks.  Follow Up Instructions:    I discussed the assessment and treatment plan with the patient. The patient was provided an opportunity to ask questions and all were answered. The patient agreed with the plan and demonstrated an understanding of the instructions.   The patient was advised to call back or seek an in-person evaluation if the symptoms worsen or if the condition fails to improve as anticipated.  I  provided 28 minutes of non-face-to-face time during this encounter.   Cleotis Nipper, MD

## 2022-01-08 ENCOUNTER — Telehealth: Payer: BC Managed Care – PPO | Admitting: Emergency Medicine

## 2022-01-08 ENCOUNTER — Encounter: Payer: Self-pay | Admitting: Emergency Medicine

## 2022-01-08 DIAGNOSIS — F40243 Fear of flying: Secondary | ICD-10-CM

## 2022-01-08 DIAGNOSIS — T753XXA Motion sickness, initial encounter: Secondary | ICD-10-CM

## 2022-01-08 MED ORDER — ALPRAZOLAM 0.5 MG PO TABS: 0.5000 mg | ORAL_TABLET | Freq: Two times a day (BID) | ORAL | 0 refills | Status: DC | PRN

## 2022-01-08 MED ORDER — SCOPOLAMINE 1 MG/3DAYS TD PT72: 1.0000 | MEDICATED_PATCH | TRANSDERMAL | 12 refills | Status: DC

## 2022-01-08 NOTE — Progress Notes (Signed)
Telemedicine Encounter- SOAP NOTE Established Patient MyChart video encounter Patient: Home  Provider: Office   Patient present only  This video encounter was conducted with the patient's (or proxy's) verbal consent via audio telecommunications: yes/no: Yes Patient was instructed to have this encounter in a suitably private space; and to only have persons present to whom they give permission to participate. In addition, patient identity was confirmed by use of name plus two identifiers (DOB and address).  I discussed the limitations, risks, security and privacy concerns of performing an evaluation and management service by telephone and the availability of in person appointments. I also discussed with the patient that there may be a patient responsible charge related to this service. The patient expressed understanding and agreed to proceed.  I spent a total of TIME; 0 MIN TO 60 MIN: 20 minutes talking with the patient or their proxy.  Chief complaint: Travel anxiety and sea sickness  Subjective   Danny Grant is a 36 y.o. male established patient. Telephone visit today for traveling advised. Patient will be flying to [place] next month and has flying anxiety.  Requesting medication for anxiety. He will also be taking a [place] cruise.  Has history of sea sickness.  Requesting medication. No other concerns or medical concerns today.  HPI   Patient Active Problem List   Diagnosis Date Noted   Chronic depression 08/22/2021   Chronic bilateral low back pain without sciatica 08/22/2021   Chronic abdominal pain 08/22/2021   Chronic diarrhea 08/22/2021   Morbid obesity (HCC) 08/22/2021    Past Medical History:  Diagnosis Date   Blood in stool    Depression    Migraines     Current Outpatient Medications  Medication Sig Dispense Refill   desvenlafaxine (PRISTIQ) 50 MG 24 hr tablet Take 1 tablet (50 mg total) by mouth daily. 30 tablet 1   meloxicam (MOBIC) 15 MG tablet Take  1 tablet (15 mg total) by mouth daily. 30 tablet 0   sertraline (ZOLOFT) 50 MG tablet Take 1 tablet (50 mg total) by mouth daily. (Patient not taking: Reported on 12/21/2021) 30 tablet 1   Vitamin D, Ergocalciferol, (DRISDOL) 1.25 MG (50000 UNIT) CAPS capsule Take 1 capsule (50,000 Units total) by mouth every 7 (seven) days. 7 capsule 1   No current facility-administered medications for this visit.    No Known Allergies  Social History   Socioeconomic History   Marital status: Single    Spouse name: Not on file   Number of children: Not on file   Years of education: Not on file   Highest education level: Not on file  Occupational History   Not on file  Tobacco Use   Smoking status: Never   Smokeless tobacco: Never  Substance and Sexual Activity   Alcohol use: Yes   Drug use: Never   Sexual activity: Yes  Other Topics Concern   Not on file  Social History Narrative   Not on file   Social Determinants of Health   Financial Resource Strain: Not on file  Food Insecurity: Not on file  Transportation Needs: Not on file  Physical Activity: Not on file  Stress: Not on file  Social Connections: Not on file  Intimate Partner Violence: Not on file    Review of Systems  Constitutional: Negative.  Negative for chills and fever.  HENT: Negative.    Respiratory: Negative.    Cardiovascular: Negative.   Gastrointestinal:  Negative for nausea and vomiting.  Skin:  Negative.  Negative for rash.  Neurological: Negative.   All other systems reviewed and are negative.   Objective  Alert and oriented x3 in no apparent respiratory distress Vitals as reported by the patient: There were no vitals filed for this visit.  Problem List Items Addressed This Visit   None Visit Diagnoses     Anxiety with flying    -  Primary   Relevant Medications   ALPRAZolam (XANAX) 0.5 MG tablet   Seasickness, initial encounter       Relevant Medications   scopolamine (TRANSDERM SCOP, 1.5 MG,) 1  MG/3DAYS         I discussed the assessment and treatment plan with the patient. The patient was provided an opportunity to ask questions and all were answered. The patient agreed with the plan and demonstrated an understanding of the instructions.   The patient was advised to call back or seek an in-person evaluation if the symptoms worsen or if the condition fails to improve as anticipated.  I provided 20 minutes of non-face-to-face time during this encounter.  Horald Pollen, MD  Primary Care at [facility]

## 2022-01-12 ENCOUNTER — Other Ambulatory Visit (HOSPITAL_COMMUNITY): Payer: Self-pay | Admitting: Psychiatry

## 2022-01-12 DIAGNOSIS — F4010 Social phobia, unspecified: Secondary | ICD-10-CM

## 2022-01-12 DIAGNOSIS — F32A Depression, unspecified: Secondary | ICD-10-CM

## 2022-01-25 ENCOUNTER — Other Ambulatory Visit: Payer: Self-pay | Admitting: Sports Medicine

## 2022-01-31 ENCOUNTER — Telehealth (HOSPITAL_COMMUNITY): Payer: Self-pay | Admitting: *Deleted

## 2022-01-31 NOTE — Telephone Encounter (Signed)
Pt's partner, Colletta [place], called requesting, for pt, refills on meds today. Pt has an appointment with you tomorrow @ 1100 but is leaving the country at 1500 tomorrow. Partner stated she is calling as pt is working a 14 hour shift today and is unavailable to call. Please review.

## 2022-01-31 NOTE — Telephone Encounter (Signed)
I saw that. The only one without is the Xanax. No refill?

## 2022-01-31 NOTE — Telephone Encounter (Signed)
It's straightened out.

## 2022-01-31 NOTE — Telephone Encounter (Signed)
Xanax is not given from our office.

## 2022-01-31 NOTE — Telephone Encounter (Signed)
He was given prescription on September 1 and had another refill.  He should not be out until November 1.

## 2022-02-01 ENCOUNTER — Encounter (HOSPITAL_COMMUNITY): Payer: Self-pay | Admitting: Psychiatry

## 2022-02-01 ENCOUNTER — Telehealth (HOSPITAL_BASED_OUTPATIENT_CLINIC_OR_DEPARTMENT_OTHER): Payer: BC Managed Care – PPO | Admitting: Psychiatry

## 2022-02-01 DIAGNOSIS — F4010 Social phobia, unspecified: Secondary | ICD-10-CM

## 2022-02-01 DIAGNOSIS — F32A Depression, unspecified: Secondary | ICD-10-CM

## 2022-02-01 MED ORDER — DESVENLAFAXINE SUCCINATE ER 100 MG PO TB24: 100.0000 mg | ORAL_TABLET | Freq: Every day | ORAL | 0 refills | Status: AC

## 2022-02-01 NOTE — Progress Notes (Signed)
Virtual Visit via Video Note  I connected with Collier Flowers on 02/01/22 at 11:00 AM EDT by a video enabled telemedicine application and verified that I am speaking with the correct person using two identifiers.  Location: Patient: In Truck Provider: Home Office   I discussed the limitations of evaluation and management by telemedicine and the availability of in person appointments. The patient expressed understanding and agreed to proceed.  History of Present Illness: Patient is evaluated by video session.  We started him on Pristiq 50 mg.  He is taking every day.  He still have anxiety and nervousness but he reported his girlfriend noticed that he is not fixated on things.  He reported last week more stress from work because certain machines were not working and he has to wake up in the morning.  He is also very anxious about upcoming trip to [place] and then [place] cruise.  He is flying today to [place].  He got Xanax and scopolamine from his PCP because he is very concerned before flying.  He took 1 Xanax a few days ago to see how it affects and he reported that make him tired and sleepy.  His girlfriend is very supportive.  Patient told his sister back out from the flying.  Patient told his sister also has significant anxiety.  So far he is tolerating Pristiq and reported no tremors, shakes or any EPS.  He does not feel very groggy or sleepy.  He is not taking Zoloft which made him tired.  He still feels anxious around people but denies any recent sweating in his hands.  He does have hives when he is under a lot of stress and recently he had a breakout but is getting better.  He denies drinking or using any illegal substances.  His appetite is okay.  His weight is unchanged from the past.   Past Psychiatric History: Chronic history of depression, anxiety.  Zoloft did not helped. No h/o suicidal attempt, psychosis, mania, inpatient treatment.    Psychiatric Specialty Exam: Physical Exam   Review of Systems  Weight 295 lb (133.8 kg).There is no height or weight on file to calculate BMI.  General Appearance: Casual  Eye Contact:  Fair  Speech:  Slow  Volume:  Decreased  Mood:  Anxious and shy  Affect:  Congruent  Thought Process:  Goal Directed  Orientation:  Full (Time, Place, and Person)  Thought Content:  Rumination  Suicidal Thoughts:  No  Homicidal Thoughts:  No  Memory:  Immediate;   Good Recent;   Good Remote;   Good  Judgement:  Intact  Insight:  Present  Psychomotor Activity:  Normal  Concentration:  Concentration: Good and Attention Span: Good  Recall:  Good  Fund of Knowledge:  Good  Language:  Good  Akathisia:  No  Handed:  Right  AIMS (if indicated):     Assets:  Communication Skills Desire for Improvement Housing Social Support Talents/Skills Transportation  ADL's:  Intact  Cognition:  WNL  Sleep:   fair      Assessment and Plan: Social anxiety disorder.  Chronic depression.  Patient is nervous about upcoming trip to [place] with his girlfriend and then cruise to [place].  He received Xanax 0.5 mg from his PCP and also scopolamine in case he needed for sea sickness.  He took 1 pill of Xanax which make him tired and sleepy.  I encourage should take the Xanax before flying and we discussed in detail about benzodiazepine dependence  tolerance and withdrawal.  Patient told he is scared to take any medication that make him dependent.  However he feels he can try higher dose of Pristiq to help his anxiety and depression.  So far he is tolerating and reported no side effects.  We will increase Pristiq from 50 mg to 100 mg.  Discussed medication side effects and benefits.  Recommend to call us back with any question or any concern.  We will follow up in 3 months.  Follow Up Instructions:    I discussed the assessment and treatment plan with the patient. The patient was provided an opportunity to ask questions and all were answered. The patient  agreed with the plan and demonstrated an understanding of the instructions.   The patient was advised to call back or seek an in-person evaluation if the symptoms worsen or if the condition fails to improve as anticipated.  Collaboration of Care: Primary Care Provider AEB notes in epic to review.  Patient/Guardian was advised Release of Information must be obtained prior to any record release in order to collaborate their care with an outside provider. Patient/Guardian was advised if they have not already done so to contact the registration department to sign all necessary forms in order for [place] to release information regarding their care.   Consent: Patient/Guardian gives verbal consent for treatment and assignment of benefits for services provided during this visit. Patient/Guardian expressed understanding and agreed to proceed.    I provided 22 minutes of non-face-to-face time during this encounter.   Kathlee Nations, MD

## 2022-02-18 ENCOUNTER — Ambulatory Visit: Payer: BC Managed Care – PPO | Admitting: Emergency Medicine

## 2022-02-20 ENCOUNTER — Encounter: Payer: Self-pay | Admitting: Emergency Medicine

## 2022-02-20 ENCOUNTER — Ambulatory Visit: Payer: BC Managed Care – PPO | Admitting: Emergency Medicine

## 2022-02-20 VITALS — BP 126/88 | HR 73 | Temp 98.0°F | Ht 72.0 in | Wt 297.1 lb

## 2022-02-20 DIAGNOSIS — K429 Umbilical hernia without obstruction or gangrene: Secondary | ICD-10-CM

## 2022-02-20 DIAGNOSIS — R202 Paresthesia of skin: Secondary | ICD-10-CM | POA: Diagnosis not present

## 2022-02-20 DIAGNOSIS — F32A Depression, unspecified: Secondary | ICD-10-CM | POA: Diagnosis not present

## 2022-02-20 NOTE — Assessment & Plan Note (Signed)
Of left fifth toe.  Uncertain etiology. Blood work done today.

## 2022-02-20 NOTE — Progress Notes (Signed)
Danny Grant 36 y.o.   Chief Complaint  Patient presents with   Follow-up    49mnth f/u appt, patient thinks he has a abd hernia that is bothering him, numbness side of toe, left foot     HISTORY OF PRESENT ILLNESS: This is a 36 y.o. male here for 87-month follow-up. Complaining of umbilical hernia and also numbness to left fifth toe. No other complaints or medical concerns today. Wt Readings from Last 3 Encounters:  02/20/22 297 lb 2 oz (134.8 kg)  09/20/21 (!) 311 lb (141.1 kg)  08/28/21 (!) 309 lb (140.2 kg)     HPI   Prior to Admission medications   Medication Sig Start Date End Date Taking? Authorizing Provider  ALPRAZolam Duanne Moron) 0.5 MG tablet Take 1 tablet (0.5 mg total) by mouth 2 (two) times daily as needed for anxiety. 01/08/22  Yes Horald Pollen, MD  desvenlafaxine (PRISTIQ) 100 MG 24 hr tablet Take 1 tablet (100 mg total) by mouth daily. 02/01/22  Yes Arfeen, Arlyce Harman, MD  meloxicam (MOBIC) 15 MG tablet Take 1 tablet (15 mg total) by mouth daily. 09/20/21  Yes Glennon Mac, DO  Vitamin D, Ergocalciferol, (DRISDOL) 1.25 MG (50000 UNIT) CAPS capsule Take 1 capsule (50,000 Units total) by mouth every 7 (seven) days. 08/22/21  Yes Horald Pollen, MD    No Known Allergies  Patient Active Problem List   Diagnosis Date Noted   Chronic depression 08/22/2021   Chronic bilateral low back pain without sciatica 08/22/2021   Chronic abdominal pain 08/22/2021   Chronic diarrhea 08/22/2021   Morbid obesity ([place]) 08/22/2021    Past Medical History:  Diagnosis Date   Blood in stool    Depression    Migraines     No past surgical history on file.  Social History   Socioeconomic History   Marital status: Single    Spouse name: Not on file   Number of children: Not on file   Years of education: Not on file   Highest education level: Not on file  Occupational History   Not on file  Tobacco Use   Smoking status: Never   Smokeless tobacco: Never   Substance and Sexual Activity   Alcohol use: Yes   Drug use: Never   Sexual activity: Yes  Other Topics Concern   Not on file  Social History Narrative   Not on file   Social Determinants of Health   Financial Resource Strain: Not on file  Food Insecurity: Not on file  Transportation Needs: Not on file  Physical Activity: Not on file  Stress: Not on file  Social Connections: Not on file  Intimate Partner Violence: Not on file    Family History  Problem Relation Age of Onset   Intellectual disability Mother    Early death Mother    Depression Mother    Cancer Mother    Early death Father    Depression Father    Cancer Father    Intellectual disability Father    Depression Sister    Intellectual disability Sister    Early death Maternal Grandmother    Cancer Maternal Grandfather    Hyperlipidemia Paternal Grandmother    Heart disease Paternal Grandmother    Early death Paternal Grandmother      Review of Systems  Constitutional: Negative.  Negative for chills and fever.  HENT: Negative.  Negative for congestion and sore throat.   Respiratory: Negative.  Negative for cough and shortness of breath.  Cardiovascular: Negative.  Negative for chest pain and palpitations.  Gastrointestinal:  Negative for abdominal pain, nausea and vomiting.  Genitourinary: Negative.   Skin: Negative.  Negative for rash.  Neurological: Negative.  Negative for dizziness and headaches.  All other systems reviewed and are negative.  Vitals:   02/20/22 1604  BP: 126/88  Pulse: 73  Temp: 98 F (36.7 C)  SpO2: 97%     Physical Exam Vitals reviewed.  Constitutional:      Appearance: Normal appearance.  HENT:     Head: Normocephalic.  Eyes:     Extraocular Movements: Extraocular movements intact.     Pupils: Pupils are equal, round, and reactive to light.  Cardiovascular:     Rate and Rhythm: Normal rate and regular rhythm.     Pulses: Normal pulses.     Heart sounds: Normal  heart sounds.  Pulmonary:     Effort: Pulmonary effort is normal.     Breath sounds: Normal breath sounds.  Abdominal:     General: Bowel sounds are normal.     Palpations: Abdomen is soft.     Tenderness: There is no abdominal tenderness.     Hernia: A hernia (Umbilical hernia) is present.  Skin:    General: Skin is warm and dry.     Comments: Left foot: Warm to touch.  Good peripheral pulses.  Good capillary refill.  Good distal circulation.  Full range of motion of all toes.  No sensory deficit detected.  Neurological:     General: No focal deficit present.     Mental Status: He is alert and oriented to person, place, and time.  Psychiatric:        Mood and Affect: Mood normal.        Behavior: Behavior normal.      ASSESSMENT & PLAN: A total of 45 minutes was spent with the patient and counseling/coordination of care regarding preparing for this visit, review of most recent office visit notes, review of multiple chronic medical problems and their management, review of all medications, finding of umbilical hernia and need for surgical evaluation, education on nutrition, symptom of toe paresthesia and need for blood work today, prognosis, documentation, and need for follow-up  Problem List Items Addressed This Visit       Other   Chronic depression    Stable.  Sees psychologist on a regular basis. Presently taking Pristiq 100 mg daily.      Morbid obesity ([place])    Eating better and exercising more. Losing weight and feeling better. Wt Readings from Last 3 Encounters:  02/20/22 297 lb 2 oz (134.8 kg)  09/20/21 (!) 311 lb (141.1 kg)  08/28/21 (!) 309 lb (140.2 kg)         Paresthesia    Of left fifth toe.  Uncertain etiology. Blood work done today.      Relevant Orders   CBC with Differential/Platelet   Comprehensive metabolic panel   Hemoglobin A1c   Vitamin B12   Lipid panel   Umbilical hernia without obstruction and without gangrene - Primary    No  complications.  Not tender to palpation. However affecting quality of life. Needs surgical evaluation Referral placed today.      Relevant Orders   Ambulatory referral to General Surgery   Patient Instructions  Umbilical Hernia, Adult  A hernia is a bulge of tissue that pushes through an opening between muscles. An umbilical hernia happens in the abdomen, near the belly button (umbilicus). The  hernia may contain tissues from the small intestine, large intestine, or fatty tissue covering the intestines. Umbilical hernias in adults tend to get worse over time, and they require surgical treatment. There are different types of umbilical hernias, including: Indirect hernia. This type is located just above or below the umbilicus. It is the most common type of umbilical hernia in adults. Direct hernia. This type forms through an opening formed by the umbilicus. Reducible hernia. This type of hernia comes and goes. It may be visible only when you strain, lift something heavy, or cough. This type of hernia can be pushed back into the abdomen (reduced). Incarcerated hernia. This type traps abdominal tissue inside the hernia. This type of hernia cannot be reduced. Strangulated hernia. This type of hernia cuts off blood flow to the tissues inside the hernia. The tissues can start to die if this happens. This type of hernia requires emergency treatment. What are the causes? An umbilical hernia happens when tissue inside the abdomen presses on a weak area of the abdominal muscles. What increases the risk? You may have a greater risk of this condition if you: Are obese. Have had several pregnancies. Have a buildup of fluid inside your abdomen. Have had surgery that weakens the abdominal muscles. What are the signs or symptoms? The main symptom of this condition is a painless bulge at or near the belly button. A reducible hernia may be visible only when you strain, lift something heavy, or cough. Other  symptoms may include: Dull pain. A feeling of pressure. Symptoms of a strangulated hernia may include: Pain that gets increasingly worse. Nausea and vomiting. Pain when pressing on the hernia. Skin over the hernia becoming red or purple. Constipation. Blood in the stool. How is this diagnosed? This condition may be diagnosed based on: A physical exam. You may be asked to cough or strain while standing. These actions increase the pressure inside your abdomen and can force the hernia through the opening in your muscles. Your health care provider may try to reduce the hernia by pressing on it. Your symptoms and medical history. How is this treated? Surgery is the only treatment for an umbilical hernia. Surgery for a strangulated hernia is done as soon as possible. If you have a small hernia that is not incarcerated, you may need to lose weight before having surgery. Follow these instructions at home: Lose weight, if told by your health care provider. Do not try to push the hernia back in. Watch your hernia for any changes in color or size. Tell your health care provider if any changes occur. You may need to avoid activities that increase pressure on your hernia. Do not lift anything that is heavier than 10 lb (4.5 kg), or the limit that you are told, until your health care provider says that it is safe. Take over-the-counter and prescription medicines only as told by your health care provider. Keep all follow-up visits. This is important. Contact a health care provider if: Your hernia gets larger. Your hernia becomes painful. Get help right away if: You develop sudden, severe pain near the area of your hernia. You have pain as well as nausea or vomiting. You have pain and the skin over your hernia changes color. You develop a fever or chills. Summary A hernia is a bulge of tissue that pushes through an opening between muscles. An umbilical hernia happens near the belly button. Surgery  is the only treatment for an umbilical hernia. Do not try to push  your hernia back in. Keep all follow-up visits. This is important. This information is not intended to replace advice given to you by your health care provider. Make sure you discuss any questions you have with your health care provider. Document Revised: 11/15/2019 Document Reviewed: 11/15/2019 Elsevier Patient Education  [place], MD [facility]

## 2022-02-20 NOTE — Assessment & Plan Note (Signed)
Stable.  Sees psychologist on a regular basis. Presently taking Pristiq 100 mg daily.

## 2022-02-20 NOTE — Assessment & Plan Note (Signed)
No complications.  Not tender to palpation. However affecting quality of life. Needs surgical evaluation Referral placed today.

## 2022-02-20 NOTE — Assessment & Plan Note (Signed)
Eating better and exercising more. Losing weight and feeling better. Wt Readings from Last 3 Encounters:  02/20/22 297 lb 2 oz (134.8 kg)  09/20/21 (!) 311 lb (141.1 kg)  08/28/21 (!) 309 lb (140.2 kg)

## 2022-02-20 NOTE — Patient Instructions (Signed)
Umbilical Hernia, Adult  A hernia is a bulge of tissue that pushes through an opening between muscles. An umbilical hernia happens in the abdomen, near the belly button (umbilicus). The hernia may contain tissues from the small intestine, large intestine, or fatty tissue covering the intestines. Umbilical hernias in adults tend to get worse over time, and they require surgical treatment. There are different types of umbilical hernias, including: Indirect hernia. This type is located just above or below the umbilicus. It is the most common type of umbilical hernia in adults. Direct hernia. This type forms through an opening formed by the umbilicus. Reducible hernia. This type of hernia comes and goes. It may be visible only when you strain, lift something heavy, or cough. This type of hernia can be pushed back into the abdomen (reduced). Incarcerated hernia. This type traps abdominal tissue inside the hernia. This type of hernia cannot be reduced. Strangulated hernia. This type of hernia cuts off blood flow to the tissues inside the hernia. The tissues can start to die if this happens. This type of hernia requires emergency treatment. What are the causes? An umbilical hernia happens when tissue inside the abdomen presses on a weak area of the abdominal muscles. What increases the risk? You may have a greater risk of this condition if you: Are obese. Have had several pregnancies. Have a buildup of fluid inside your abdomen. Have had surgery that weakens the abdominal muscles. What are the signs or symptoms? The main symptom of this condition is a painless bulge at or near the belly button. A reducible hernia may be visible only when you strain, lift something heavy, or cough. Other symptoms may include: Dull pain. A feeling of pressure. Symptoms of a strangulated hernia may include: Pain that gets increasingly worse. Nausea and vomiting. Pain when pressing on the hernia. Skin over the hernia  becoming red or purple. Constipation. Blood in the stool. How is this diagnosed? This condition may be diagnosed based on: A physical exam. You may be asked to cough or strain while standing. These actions increase the pressure inside your abdomen and can force the hernia through the opening in your muscles. Your health care provider may try to reduce the hernia by pressing on it. Your symptoms and medical history. How is this treated? Surgery is the only treatment for an umbilical hernia. Surgery for a strangulated hernia is done as soon as possible. If you have a small hernia that is not incarcerated, you may need to lose weight before having surgery. Follow these instructions at home: Lose weight, if told by your health care provider. Do not try to push the hernia back in. Watch your hernia for any changes in color or size. Tell your health care provider if any changes occur. You may need to avoid activities that increase pressure on your hernia. Do not lift anything that is heavier than 10 lb (4.5 kg), or the limit that you are told, until your health care provider says that it is safe. Take over-the-counter and prescription medicines only as told by your health care provider. Keep all follow-up visits. This is important. Contact a health care provider if: Your hernia gets larger. Your hernia becomes painful. Get help right away if: You develop sudden, severe pain near the area of your hernia. You have pain as well as nausea or vomiting. You have pain and the skin over your hernia changes color. You develop a fever or chills. Summary A hernia is a bulge of   tissue that pushes through an opening between muscles. An umbilical hernia happens near the belly button. Surgery is the only treatment for an umbilical hernia. Do not try to push your hernia back in. Keep all follow-up visits. This is important. This information is not intended to replace advice given to you by your health care  provider. Make sure you discuss any questions you have with your health care provider. Document Revised: 11/15/2019 Document Reviewed: 11/15/2019 Elsevier Patient Education  2023 Elsevier Inc.  

## 2022-02-21 LAB — CBC WITH DIFFERENTIAL/PLATELET
Basophils Absolute: 0.1 10*3/uL (ref 0.0–0.1)
Basophils Relative: 0.9 % (ref 0.0–3.0)
Eosinophils Absolute: 0.2 10*3/uL (ref 0.0–0.7)
Eosinophils Relative: 2.8 % (ref 0.0–5.0)
HCT: 47.1 % (ref 39.0–52.0)
Hemoglobin: 15.8 g/dL (ref 13.0–17.0)
Lymphocytes Relative: 29.7 % (ref 12.0–46.0)
Lymphs Abs: 1.7 10*3/uL (ref 0.7–4.0)
MCHC: 33.6 g/dL (ref 30.0–36.0)
MCV: 91.5 fl (ref 78.0–100.0)
Monocytes Absolute: 0.7 10*3/uL (ref 0.1–1.0)
Monocytes Relative: 12 % (ref 3.0–12.0)
Neutro Abs: 3.1 10*3/uL (ref 1.4–7.7)
Neutrophils Relative %: 54.6 % (ref 43.0–77.0)
Platelets: 154 10*3/uL (ref 150.0–400.0)
RBC: 5.15 Mil/uL (ref 4.22–5.81)
RDW: 14.1 % (ref 11.5–15.5)
WBC: 5.7 10*3/uL (ref 4.0–10.5)

## 2022-02-21 LAB — COMPREHENSIVE METABOLIC PANEL
ALT: 18 U/L (ref 0–53)
AST: 17 U/L (ref 0–37)
Albumin: 4.8 g/dL (ref 3.5–5.2)
Alkaline Phosphatase: 50 U/L (ref 39–117)
BUN: 23 mg/dL (ref 6–23)
CO2: 27 mEq/L (ref 19–32)
Calcium: 9.7 mg/dL (ref 8.4–10.5)
Chloride: 105 mEq/L (ref 96–112)
Creatinine, Ser: 1.1 mg/dL (ref 0.40–1.50)
GFR: 85.21 mL/min (ref >60.00)
Glucose, Bld: 75 mg/dL (ref 70–99)
Potassium: 4.2 mEq/L (ref 3.5–5.1)
Sodium: 141 mEq/L (ref 135–145)
Total Bilirubin: 0.4 mg/dL (ref 0.2–1.2)
Total Protein: 7.4 g/dL (ref 6.0–8.3)

## 2022-02-21 LAB — LIPID PANEL
Cholesterol: 167 mg/dL (ref 0–200)
HDL: 52.9 mg/dL (ref >39.00)
LDL Cholesterol: 91 mg/dL (ref 0–99)
NonHDL: 113.78
Total CHOL/HDL Ratio: 3
Triglycerides: 115 mg/dL (ref 0.0–149.0)
VLDL: 23 mg/dL (ref 0.0–40.0)

## 2022-02-21 LAB — HEMOGLOBIN A1C: Hgb A1c MFr Bld: 5.3 % (ref 4.6–6.5)

## 2022-02-21 LAB — VITAMIN B12: Vitamin B-12: 219 pg/mL (ref 211–911)

## 2022-04-03 DIAGNOSIS — R0683 Snoring: Secondary | ICD-10-CM | POA: Diagnosis not present

## 2022-04-03 DIAGNOSIS — K429 Umbilical hernia without obstruction or gangrene: Secondary | ICD-10-CM | POA: Diagnosis not present

## 2022-04-11 ENCOUNTER — Telehealth: Payer: BC Managed Care – PPO | Admitting: Emergency Medicine

## 2022-04-11 ENCOUNTER — Telehealth (INDEPENDENT_AMBULATORY_CARE_PROVIDER_SITE_OTHER): Payer: BC Managed Care – PPO | Admitting: Nurse Practitioner

## 2022-04-11 ENCOUNTER — Encounter: Payer: Self-pay | Admitting: Nurse Practitioner

## 2022-04-11 DIAGNOSIS — G43019 Migraine without aura, intractable, without status migrainosus: Secondary | ICD-10-CM

## 2022-04-11 MED ORDER — SUMATRIPTAN SUCCINATE 50 MG PO TABS: ORAL_TABLET | ORAL | 0 refills | Status: DC

## 2022-04-11 NOTE — Addendum Note (Signed)
Addended by: Isaul Landi E on: 04/11/2022 03:57 PM   Modules accepted: Level of Service  

## 2022-04-11 NOTE — Progress Notes (Signed)
   Established Patient Office Visit  An audio/visual tele-health visit was completed today for this patient. I connected with  Danny Grant on 04/11/22 utilizing audio/visual technology and verified that I am speaking with the correct person using two identifiers. The patient was located at their home, and I was located at the office of [facility] during the encounter. I discussed the limitations of evaluation and management by telemedicine. The patient expressed understanding and agreed to proceed.     Subjective   Patient ID: Danny Grant, male    DOB: 13-Feb-1986  Age: 36 y.o. MRN: [id number]  Chief Complaint  Patient presents with   Migraine    Patient arrives for virtual visit for the above.  Reports that he recently was able to sign up for health insurance.  He is not trying to address issues that he has been experiencing when he did not have health insurance.  He reports that he has had migraines since age 66.  He reports that he self diagnosed himself with this.  Reports that 1-2 times a month he will have a unilateral headache that seems to be located behind his eye (most of the time his left eye).  The headache can last up to 3 days. The headache is associated with photophobia and phonophobia.  Only trigger is oversleeping.  He has tried ibuprofen and Excedrin without improvement in his headache.  He will have nausea with headache but denies vomiting.  Does report that sometimes he will see random spots in his vision, but this does not occur with every migraine.  Denies any personal history of known cardiovascular disease.  Has not tried prescription medication treatments for his migraines in the past.    ROS: See HPI    Objective:     There were no vitals taken for this visit.   Physical Exam Comprehensive physical exam not completed today as office visit was conducted remotely.  Patient appears well on video.  Patient was alert and oriented, and  appeared to have appropriate judgment.   No results found for any visits on 04/11/22.    The ASCVD Risk score (Arnett DK, et al., 2019) failed to calculate for the following reasons:   The 2019 ASCVD risk score is only valid for ages 16 to 79    Assessment & Plan:   Problem List Items Addressed This Visit       Cardiovascular and Mediastinum   Intractable migraine without aura and without status migrainosus - Primary    Chronic, intermittent.  Patient has tried and failed ibuprofen and Excedrin.  Does not have enough migraines to qualify for preventative treatment.  Will prescribe Imitrex 2 mg tablets, he can take 1 tablet by mouth at first sign of migraine and repeat in 2 hours if symptoms persist.  Patient was educated not to exceed 2 tablets in 24-hour period.  Patient is courage to call office if migraines do not respond to abortive therapy.  Patient reports understanding.      Relevant Medications   SUMAtriptan (IMITREX) 50 MG tablet    Return if symptoms worsen or fail to improve.    Elenore Paddy, NP

## 2022-04-11 NOTE — Assessment & Plan Note (Signed)
Chronic, intermittent.  Patient has tried and failed ibuprofen and Excedrin.  Does not have enough migraines to qualify for preventative treatment.  Will prescribe Imitrex 2 mg tablets, he can take 1 tablet by mouth at first sign of migraine and repeat in 2 hours if symptoms persist.  Patient was educated not to exceed 2 tablets in 24-hour period.  Patient is courage to call office if migraines do not respond to abortive therapy.  Patient reports understanding.

## 2022-04-29 ENCOUNTER — Other Ambulatory Visit (HOSPITAL_COMMUNITY): Payer: Self-pay | Admitting: Psychiatry

## 2022-04-29 DIAGNOSIS — F4010 Social phobia, unspecified: Secondary | ICD-10-CM

## 2022-04-29 DIAGNOSIS — F32A Depression, unspecified: Secondary | ICD-10-CM

## 2022-05-03 ENCOUNTER — Encounter (HOSPITAL_COMMUNITY): Payer: Self-pay

## 2022-05-03 ENCOUNTER — Telehealth (HOSPITAL_COMMUNITY): Payer: BC Managed Care – PPO | Admitting: Psychiatry

## 2022-05-08 ENCOUNTER — Other Ambulatory Visit: Payer: Self-pay | Admitting: Sports Medicine

## 2022-05-09 ENCOUNTER — Other Ambulatory Visit: Payer: Self-pay | Admitting: Nurse Practitioner

## 2022-05-09 DIAGNOSIS — G43019 Migraine without aura, intractable, without status migrainosus: Secondary | ICD-10-CM

## 2022-05-09 MED ORDER — MELOXICAM 15 MG PO TABS: 15.0000 mg | ORAL_TABLET | Freq: Every day | ORAL | 0 refills | Status: AC | PRN

## 2022-05-14 ENCOUNTER — Other Ambulatory Visit: Payer: Self-pay | Admitting: Nurse Practitioner

## 2022-05-14 DIAGNOSIS — G43019 Migraine without aura, intractable, without status migrainosus: Secondary | ICD-10-CM

## 2022-05-14 MED ORDER — SUMATRIPTAN SUCCINATE 50 MG PO TABS: ORAL_TABLET | ORAL | 0 refills | Status: DC

## 2022-05-20 ENCOUNTER — Encounter: Payer: Self-pay | Admitting: Neurology

## 2022-05-20 ENCOUNTER — Ambulatory Visit: Payer: BC Managed Care – PPO | Admitting: Neurology

## 2022-05-20 VITALS — BP 139/93 | HR 82 | Ht 72.0 in | Wt 302.8 lb

## 2022-05-20 DIAGNOSIS — R6889 Other general symptoms and signs: Secondary | ICD-10-CM

## 2022-05-20 DIAGNOSIS — R0683 Snoring: Secondary | ICD-10-CM

## 2022-05-20 DIAGNOSIS — G4730 Sleep apnea, unspecified: Secondary | ICD-10-CM | POA: Diagnosis not present

## 2022-05-20 DIAGNOSIS — R0681 Apnea, not elsewhere classified: Secondary | ICD-10-CM

## 2022-05-20 DIAGNOSIS — G4750 Parasomnia, unspecified: Secondary | ICD-10-CM | POA: Diagnosis not present

## 2022-05-20 DIAGNOSIS — G4761 Periodic limb movement disorder: Secondary | ICD-10-CM

## 2022-05-20 DIAGNOSIS — G478 Other sleep disorders: Secondary | ICD-10-CM | POA: Diagnosis not present

## 2022-05-20 DIAGNOSIS — G2581 Restless legs syndrome: Secondary | ICD-10-CM

## 2022-05-20 NOTE — Progress Notes (Unsigned)
Subjective:    Patient ID: Danny Grant is a 37 y.o. male.  HPI    Huston Foley, MD, PhD [facility] [address] [address] [place], [place] [address]  Dear Dr. Sheliah Hatch,  I saw your patient, Danny Grant, upon your kind request in my sleep clinic today for initial consultation of his sleep disorder, in particular, concern for underlying obstructive sleep apnea as well as parasomnias.  The patient is accompanied by his significant other, [name] today.  As you know, Mr. Salinger is a 37 year old male with an underlying medical history of migraine headaches, depression, anxiety, umbilical hernia, and severe obesity with a BMI of over 40, who reports snoring and nonrestorative sleep, restless sleep and witnessed apneas per significant other.  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  He has had vivid dreams in the recent few years, at least 5-year history of vivid dreams, sometimes yelling out in sleep, moving his arms and also twitching in his sleep.  He has a history of restless leg symptoms and does not move his legs while asleep per [employer].  He also endorses feeling restless at night.  He has been on Pristiq per PCP for the past approximately 1 year.  He has noticed a little bit more tendency to move his arms when he is asleep since taking the Pristiq.  He has an Epworth sleepiness score of 5 out of 24, fatigue severity score is 16 out of 63.  He goes to bed generally around 10 PM and rise time is around 6:30 AM.  He works on a [employer].  He has caffeine in the form of espresso, about 2 shots in the morning, otherwise no caffeine throughout the day.  He drinks alcohol occasionally, maybe up to twice a week.  He is a non-smoker.  He is not aware of any family history of sleep apnea.  He has nocturia  about once per average night, he has occasionally woken up with a headache especially if he sleeps in longer.  His Past Medical History Is Significant For: Past Medical History:  Diagnosis Date   Blood in stool    Depression    Migraines     His Past Surgical History Is Significant For: History reviewed. No pertinent surgical history.  His Family History Is Significant For: Family History  Problem Relation Age of Onset   Intellectual disability Mother    Early death Mother    Depression Mother    Cancer Mother    Early death Father    Depression Father    Cancer Father    Intellectual disability Father    Depression Sister    Intellectual disability Sister    Early death Maternal Grandmother    Cancer Maternal Grandfather    Hyperlipidemia Paternal Grandmother    Heart disease Paternal Grandmother    Early death Paternal Grandmother     His Social History Is Significant For: Social History   Socioeconomic History   Marital status: Significant Other    Spouse name: Not on file   Number of children: Not on file   Years of education: Not on file   Highest education level: Not on file  Occupational History   Not on file  Tobacco Use   Smoking status: Never   Smokeless tobacco: Never  Substance and Sexual Activity  Alcohol use: Yes   Drug use: Never   Sexual activity: Yes  Other Topics Concern   Not on file  Social History Narrative   Not on file   Social Determinants of Health   Financial Resource Strain: Not on file  Food Insecurity: Not on file  Transportation Needs: Not on file  Physical Activity: Not on file  Stress: Not on file  Social Connections: Not on file    His Allergies Are:  No Known Allergies:   His Current Medications Are:  Outpatient Encounter Medications as of 05/20/2022  Medication Sig   desvenlafaxine (PRISTIQ) 100 MG 24 hr tablet Take 1 tablet (100 mg total) by mouth daily.   meloxicam (MOBIC) 15 MG tablet Take 1 tablet (15 mg  total) by mouth daily as needed for pain.   SUMAtriptan (IMITREX) 50 MG tablet Take 1 tablet by mouth once at first sign of migraine. May take additional dose in 2 hours if symptoms persist. Do not exceed 2 tablets in 24 hours.   ALPRAZolam (XANAX) 0.5 MG tablet Take 1 tablet (0.5 mg total) by mouth 2 (two) times daily as needed for anxiety. (Patient not taking: Reported on 04/11/2022)   No facility-administered encounter medications on file as of 05/20/2022.  :   Review of Systems:  Out of a complete 14 point review of systems, all are reviewed and negative with the exception of these symptoms as listed below:   Review of Systems  Neurological:        Rm 5. Accompanied by [name]. NP / [facility] / snoring, sleep talking and vivid dreams of swimming and not being able to breathe. C/o shouting in sleep. Sleeping more than 5 hours results in headache. ESS 5 FSS 16    Objective:  Neurological Exam  Physical Exam Physical Examination:   Vitals:   05/20/22 1513 05/20/22 1518  BP: (!) 129/94 (!) 139/93  Pulse: 79 82   General Examination: The patient is a very pleasant 37 y.o. male in no acute distress. He appears well-developed and well-nourished and well groomed.   HEENT: Normocephalic, atraumatic, pupils are equal, round and reactive to light, extraocular tracking is good without limitation to gaze excursion or nystagmus noted. Hearing is grossly intact. Face is symmetric with normal facial animation. Speech is clear with no dysarthria noted. There is no hypophonia. There is no lip, neck/head, jaw or voice tremor. Neck is supple with full range of passive and active motion. There are no carotid bruits on auscultation. Oropharynx exam reveals: No significant mouth dryness, good dental hygiene, moderate airway crowding due to larger uvula, tonsillar size of about 1-2+, slightly longer tongue.  Tongue protrudes centrally and palate elevates symmetrically, no  significant overbite noted.  Neck circumference 17-7/8 inches.   Chest: Clear to auscultation without wheezing, rhonchi or crackles noted.  Heart: S1+S2+0, regular and normal without murmurs, rubs or gallops noted.   Abdomen: Soft, non-tender and non-distended.  Extremities: There is no pitting edema in the distal lower extremities bilaterally.   Skin: Warm and dry without trophic changes noted.   Musculoskeletal: exam reveals no obvious joint deformities.   Neurologically:  Mental status: The patient is awake, alert and oriented in all 4 spheres. His immediate and remote memory, attention, language skills and fund of knowledge are appropriate. There is no evidence of aphasia, agnosia, apraxia or anomia. Speech is clear with normal prosody and enunciation. Thought process is linear. Mood is normal and affect is normal.  Cranial nerves II - XII are as described above under HEENT exam.  Motor exam: Normal bulk, strength and tone is noted. There is no obvious action or resting tremor.  Fine motor skills and coordination: grossly intact.  Cerebellar testing: No dysmetria or intention tremor. There is no truncal or gait ataxia.  Sensory exam: intact to light touch in the upper and lower extremities.  Gait, station and balance: He stands easily. No veering to one side is noted. No leaning to one side is noted. Posture is age-appropriate and stance is narrow based. Gait shows normal stride length and normal pace. No problems turning are noted.   Assessment and plan:  In summary, Donzel Romack is a very pleasant 37 y.o.-year old male with an underlying medical history of migraine headaches, depression, anxiety, umbilical hernia, and severe obesity with a BMI of over 40, whose history and physical exam are concerning for sleep disordered breathing, supporting a current working diagnosis of unspecified sleep apnea, with the main differential diagnoses of obstructive sleep apnea (OSA) versus upper airway  resistance syndrome (UARS) versus central sleep apnea (CSA), or mixed sleep apnea. A laboratory attended sleep study is typically considered "gold standard" for evaluation of sleep disordered breathing.  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 I had a long chat with the patient and Colletta Maryland about my findings and the diagnosis of sleep apnea, particularly OSA, its prognosis and treatment options. We talked about medical/conservative treatments, surgical interventions and non-pharmacological approaches for symptom control. I explained, in particular, the risks and ramifications of untreated moderate to severe OSA, especially with respect to developing cardiovascular disease down the road, including congestive heart failure (CHF), difficult to treat hypertension, cardiac arrhythmias (particularly A-fib), neurovascular complications including TIA, stroke and dementia. Even type 2 diabetes has, in part, been linked to untreated OSA. Symptoms of untreated OSA may include (but may not be limited to) daytime sleepiness, nocturia (i.e. frequent nighttime urination), memory problems, mood irritability and suboptimally controlled or worsening mood disorder such as depression and/or anxiety, lack of energy, lack of motivation, physical discomfort, as well as recurrent headaches, especially morning or nocturnal headaches. We talked about the importance of maintaining a healthy lifestyle and striving for healthy weight. In addition, we talked about the importance of striving for and maintaining good sleep hygiene. I recommended a sleep study at this time. I  outlined the differences between a laboratory attended sleep study which is considered more comprehensive and accurate over the option of a home sleep test (HST); the latter may lead to underestimation of sleep disordered breathing in some instances and does not help with diagnosing upper airway resistance syndrome and is not accurate enough to diagnose primary central sleep apnea typically. I outlined possible surgical and non-surgical treatment options of OSA, including the use of a positive airway pressure (PAP) device (i.e. CPAP, AutoPAP/APAP or BiPAP in certain circumstances), a custom-made dental device (aka oral appliance, which would require a referral to a specialist dentist or orthodontist typically, and is generally speaking not considered for patients with full dentures or edentulous state), upper airway surgical options, such as traditional UPPP (which is not considered a first-line treatment)  or the Inspire device (hypoglossal nerve stimulator, which would involve a referral for consultation with an ENT surgeon, after careful selection, following inclusion criteria - also not first-line treatment). I explained the PAP treatment option to the patient in detail, as this is generally considered first-line treatment.  The patient indicated that he would be willing to try PAP therapy, if the need arises. I explained the importance of being compliant with PAP treatment, not only for insurance purposes but primarily to improve patient's symptoms symptoms, and for the patient's long term health benefit, including to reduce His cardiovascular risks longer-term.  He is advised that treating obstructive sleep apnea may result in improvement in his sleep disruption, reduction in parasomnias, and also reduction in restless leg symptoms potentially.   We will pick up our discussion about the next steps and treatment options after testing.  We will keep them posted as to the test results by phone call and/or  MyChart messaging where possible.  We will plan to follow-up in sleep clinic accordingly as well.  I answered all their questions today and the patient and his SO were in agreement.   I encouraged him to call with any interim questions, concerns, problems or updates or email [place] through [employer].  Generally speaking, sleep test authorizations may take up to 2 weeks, sometimes less, sometimes longer, the patient is encouraged to get in touch with [place] if they do not hear back from the sleep lab staff directly within the next 2 weeks.  Thank you very much for allowing me to participate in the care of this nice patient. If I can be of any further assistance to you please do not hesitate to call me at [phone].  Sincerely,   Star Age, MD, PhD

## 2022-05-20 NOTE — Progress Notes (Unsigned)
Subjective:    Patient ID: Danny Grant is a 37 y.o. male.  HPI {Common ambulatory SmartLinks:[id number]}  Review of Systems  Objective:  Neurological Exam  Physical Exam  Assessment:   ***  Plan:   ***

## 2022-05-20 NOTE — Patient Instructions (Signed)

## 2022-06-14 ENCOUNTER — Other Ambulatory Visit: Payer: Self-pay | Admitting: Sports Medicine

## 2022-07-03 ENCOUNTER — Other Ambulatory Visit: Payer: Self-pay | Admitting: Sports Medicine

## 2022-07-23 ENCOUNTER — Other Ambulatory Visit: Payer: Self-pay | Admitting: Emergency Medicine

## 2022-07-23 DIAGNOSIS — G43019 Migraine without aura, intractable, without status migrainosus: Secondary | ICD-10-CM

## 2022-07-24 MED ORDER — SUMATRIPTAN SUCCINATE 50 MG PO TABS: ORAL_TABLET | ORAL | 0 refills | Status: DC

## 2022-09-24 ENCOUNTER — Other Ambulatory Visit: Payer: Self-pay | Admitting: Emergency Medicine

## 2022-09-24 DIAGNOSIS — G43019 Migraine without aura, intractable, without status migrainosus: Secondary | ICD-10-CM

## 2022-09-24 DIAGNOSIS — F40243 Fear of flying: Secondary | ICD-10-CM

## 2022-09-25 MED ORDER — ALPRAZOLAM 0.5 MG PO TABS: 0.5000 mg | ORAL_TABLET | Freq: Two times a day (BID) | ORAL | 0 refills | Status: AC | PRN

## 2022-09-25 MED ORDER — SUMATRIPTAN SUCCINATE 50 MG PO TABS: ORAL_TABLET | ORAL | 0 refills | Status: AC

## 2023-12-04 DIAGNOSIS — Z0000 Encounter for general adult medical examination without abnormal findings: Secondary | ICD-10-CM | POA: Diagnosis not present

## 2023-12-04 DIAGNOSIS — Z6841 Body Mass Index (BMI) 40.0 and over, adult: Secondary | ICD-10-CM | POA: Diagnosis not present

## 2023-12-04 DIAGNOSIS — E66813 Obesity, class 3: Secondary | ICD-10-CM | POA: Diagnosis not present

## 2023-12-29 IMAGING — DX DG LUMBAR SPINE 2-3V
3 series · 3 of 3 positions shown · non-contrast
Comparison: None Available.

CLINICAL DATA: Chronic low back pain for 18 years.

EXAM:
LUMBAR SPINE - 2-3 VIEW

[l-spine ap]
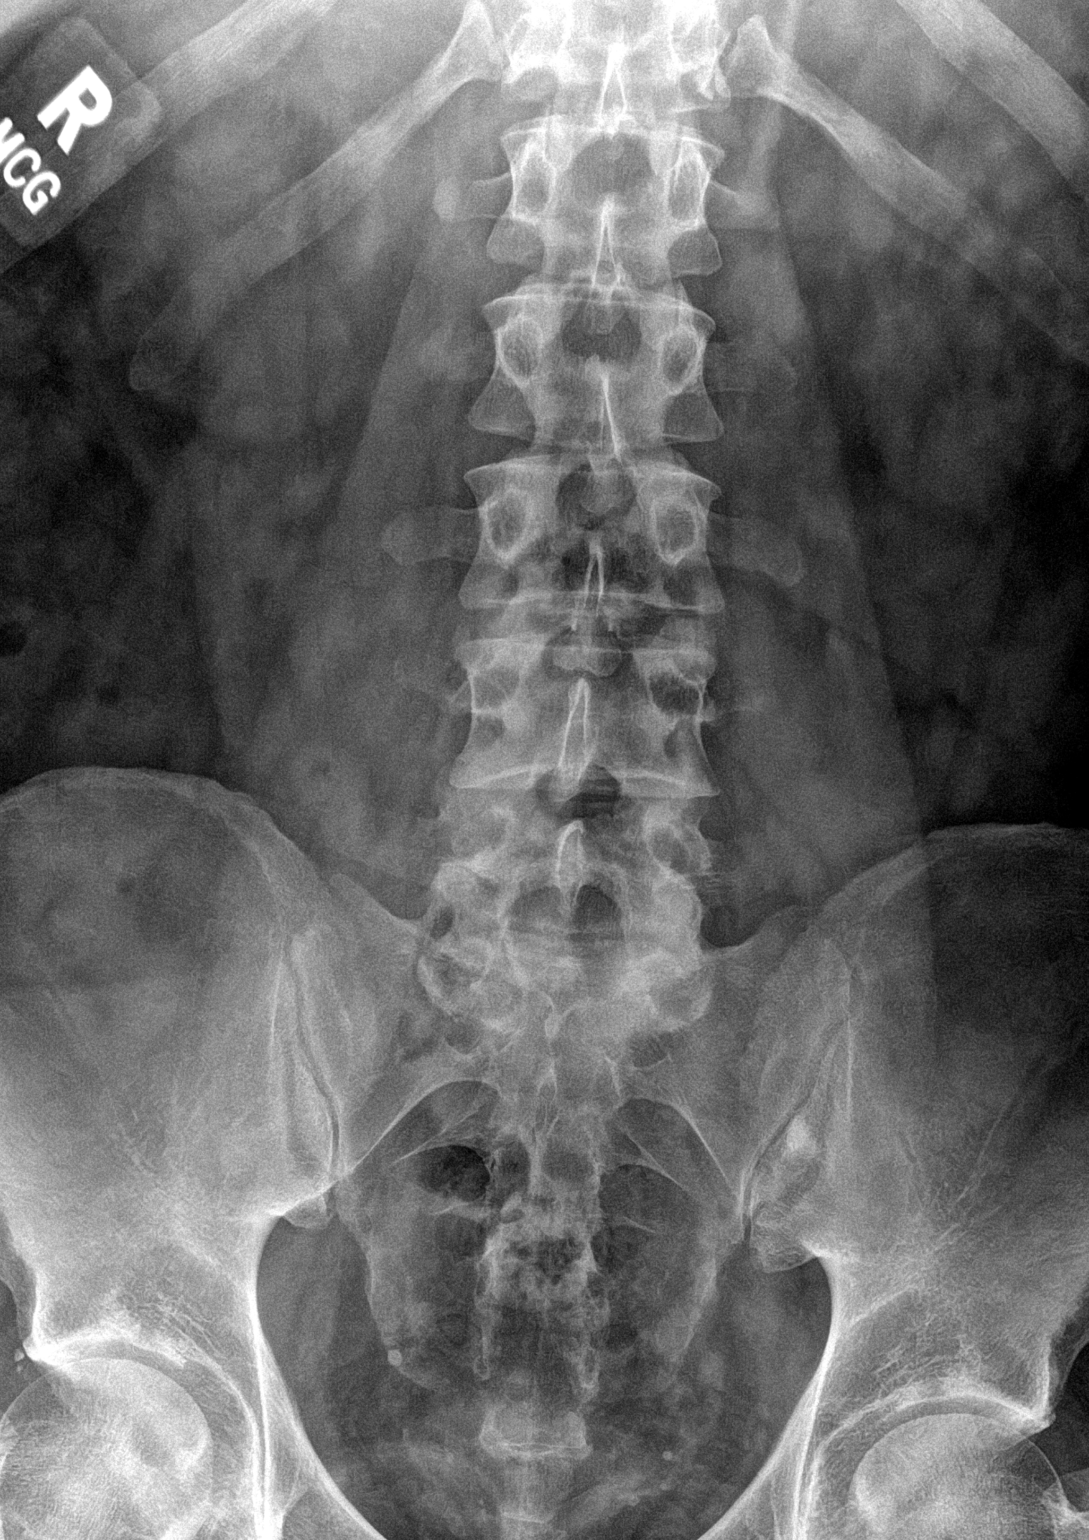

[l-spine lateral]
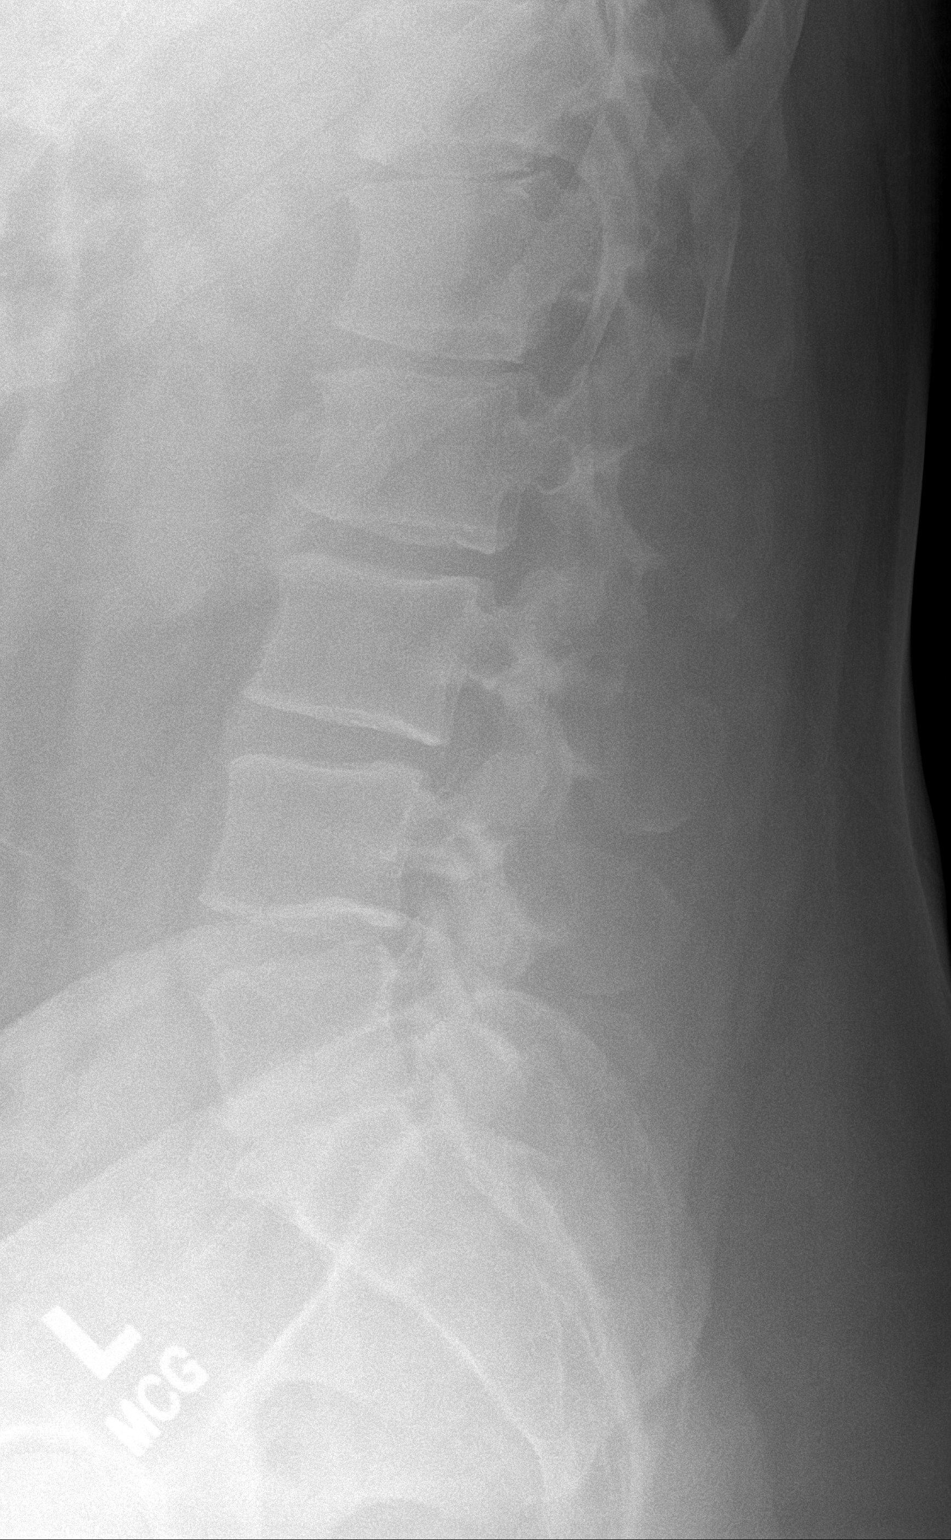

[l-spine spot]
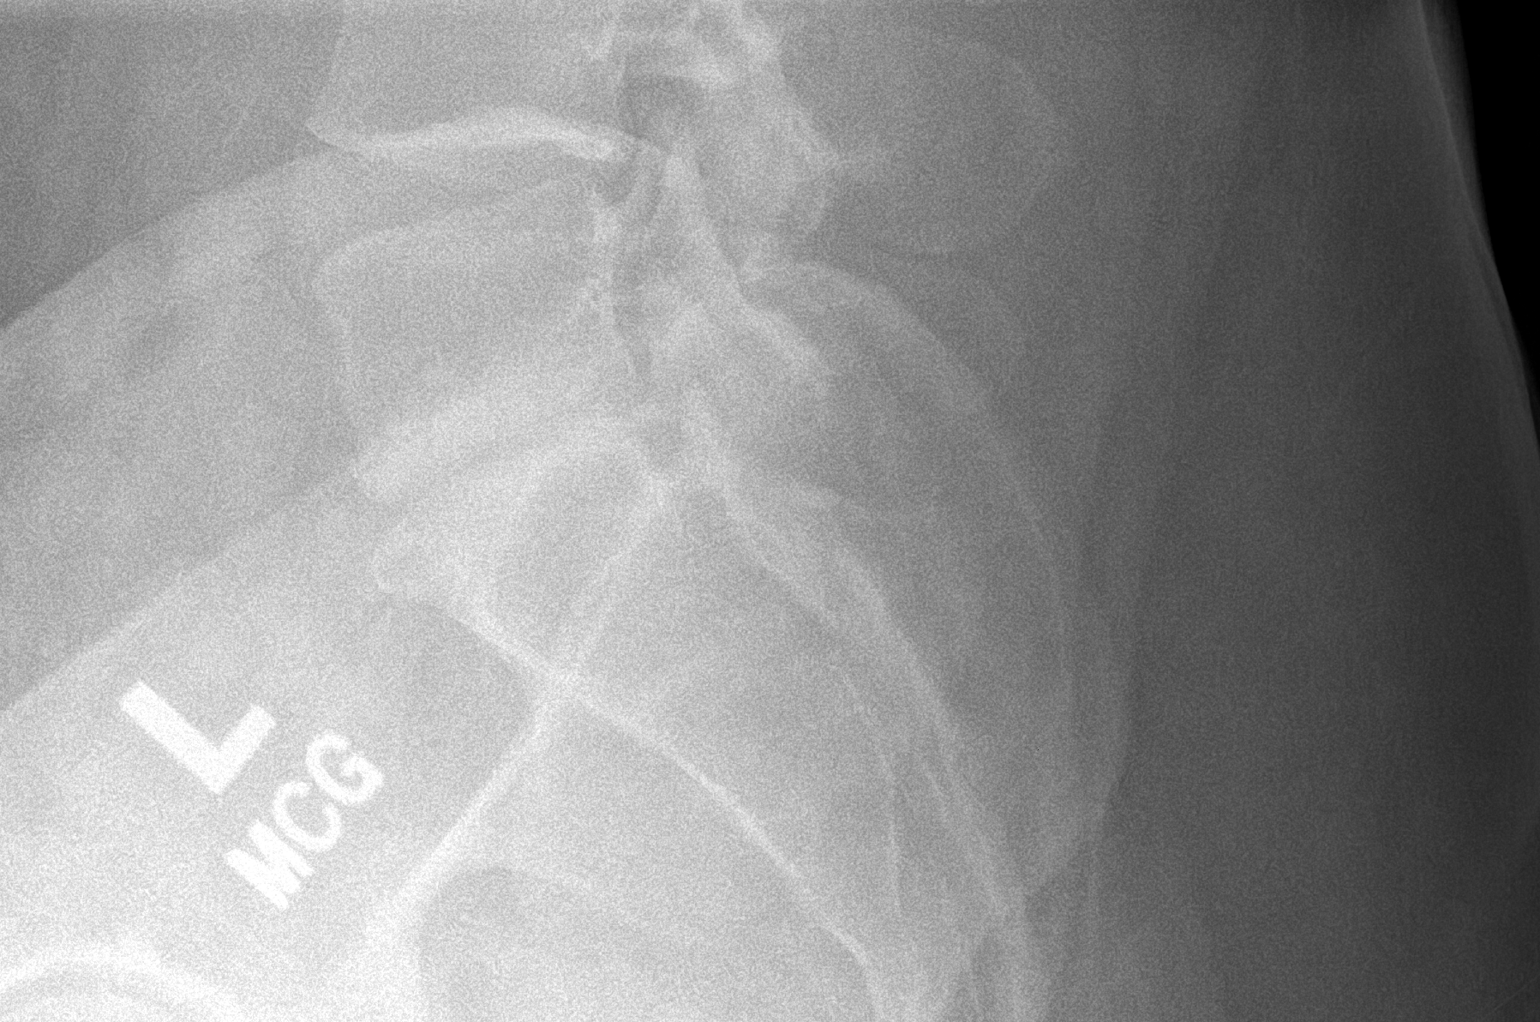

[3 of 3 positions shown; findings below may reference images not displayed]

FINDINGS: There are 5 lumbar type vertebral bodies. The alignment is normal.
There is disc space narrowing with endplate sclerosis at L5-S1. The
additional disc spaces are preserved. No evidence of acute fracture
or pars defect. There may be mild facet degenerative changes
inferiorly. The sacroiliac joints appear normal.
IMPRESSION: Evidence of chronic degenerative disc disease at L5-S1. No acute
osseous findings or malalignment.
